# Patient Record
Sex: Female | Born: 1975
Health system: Southern US, Community
[De-identification: ages and names within clinical notes are randomized; demographics above are authoritative.]

## PROBLEM LIST (undated history)

## (undated) DIAGNOSIS — D649 Anemia, unspecified: Secondary | ICD-10-CM

## (undated) DIAGNOSIS — R519 Headache, unspecified: Secondary | ICD-10-CM

## (undated) DIAGNOSIS — R51 Headache: Secondary | ICD-10-CM

## (undated) DIAGNOSIS — M1990 Unspecified osteoarthritis, unspecified site: Secondary | ICD-10-CM

## (undated) DIAGNOSIS — J45909 Unspecified asthma, uncomplicated: Secondary | ICD-10-CM

## (undated) DIAGNOSIS — T7840XA Allergy, unspecified, initial encounter: Secondary | ICD-10-CM

## (undated) HISTORY — PX: BREAST SURGERY: SHX581

## (undated) HISTORY — DX: Allergy, unspecified, initial encounter: T78.40XA

## (undated) HISTORY — DX: Unspecified asthma, uncomplicated: J45.909

## (undated) HISTORY — DX: Anemia, unspecified: D64.9

---

## 2017-01-10 ENCOUNTER — Other Ambulatory Visit (HOSPITAL_COMMUNITY)
Admission: RE | Admit: 2017-01-10 | Discharge: 2017-01-10 | Disposition: A | Discharge status: Home / Self Care | Payer: 59 | Source: Ambulatory Visit | Attending: Obstetrics and Gynecology | Admitting: Obstetrics and Gynecology

## 2017-01-10 ENCOUNTER — Other Ambulatory Visit: Payer: Self-pay | Admitting: Obstetrics and Gynecology

## 2017-01-10 DIAGNOSIS — N852 Hypertrophy of uterus: Secondary | ICD-10-CM | POA: Diagnosis not present

## 2017-01-10 DIAGNOSIS — Z124 Encounter for screening for malignant neoplasm of cervix: Secondary | ICD-10-CM | POA: Insufficient documentation

## 2017-01-10 DIAGNOSIS — N921 Excessive and frequent menstruation with irregular cycle: Secondary | ICD-10-CM | POA: Diagnosis not present

## 2017-01-10 DIAGNOSIS — D500 Iron deficiency anemia secondary to blood loss (chronic): Secondary | ICD-10-CM | POA: Diagnosis not present

## 2017-01-10 DIAGNOSIS — Z3169 Encounter for other general counseling and advice on procreation: Secondary | ICD-10-CM | POA: Diagnosis not present

## 2017-01-13 LAB — CYTOLOGY - PAP
DIAGNOSIS: NEGATIVE
HPV: NOT DETECTED

## 2017-01-17 DIAGNOSIS — N921 Excessive and frequent menstruation with irregular cycle: Secondary | ICD-10-CM | POA: Diagnosis not present

## 2017-01-17 DIAGNOSIS — R938 Abnormal findings on diagnostic imaging of other specified body structures: Secondary | ICD-10-CM | POA: Diagnosis not present

## 2017-01-17 DIAGNOSIS — D500 Iron deficiency anemia secondary to blood loss (chronic): Secondary | ICD-10-CM | POA: Diagnosis not present

## 2017-02-28 DIAGNOSIS — D500 Iron deficiency anemia secondary to blood loss (chronic): Secondary | ICD-10-CM | POA: Diagnosis not present

## 2017-02-28 DIAGNOSIS — N9489 Other specified conditions associated with female genital organs and menstrual cycle: Secondary | ICD-10-CM | POA: Diagnosis not present

## 2017-02-28 DIAGNOSIS — R938 Abnormal findings on diagnostic imaging of other specified body structures: Secondary | ICD-10-CM | POA: Diagnosis not present

## 2017-02-28 DIAGNOSIS — N921 Excessive and frequent menstruation with irregular cycle: Secondary | ICD-10-CM | POA: Diagnosis not present

## 2017-03-16 ENCOUNTER — Ambulatory Visit (INDEPENDENT_AMBULATORY_CARE_PROVIDER_SITE_OTHER): Payer: 59 | Admitting: Family Medicine

## 2017-03-16 ENCOUNTER — Encounter: Payer: Self-pay | Admitting: Family Medicine

## 2017-03-16 VITALS — BP 122/66 | HR 82 | Temp 98.3°F | Resp 14 | Ht <= 58 in | Wt 123.0 lb

## 2017-03-16 DIAGNOSIS — Z23 Encounter for immunization: Secondary | ICD-10-CM | POA: Diagnosis not present

## 2017-03-16 DIAGNOSIS — D509 Iron deficiency anemia, unspecified: Secondary | ICD-10-CM

## 2017-03-16 DIAGNOSIS — J4520 Mild intermittent asthma, uncomplicated: Secondary | ICD-10-CM

## 2017-03-16 DIAGNOSIS — Z0000 Encounter for general adult medical examination without abnormal findings: Secondary | ICD-10-CM

## 2017-03-16 DIAGNOSIS — M1000 Idiopathic gout, unspecified site: Secondary | ICD-10-CM | POA: Diagnosis not present

## 2017-03-16 DIAGNOSIS — M109 Gout, unspecified: Secondary | ICD-10-CM | POA: Insufficient documentation

## 2017-03-16 MED ORDER — IRON-VITAMIN C 65-125 MG PO TABS: ORAL_TABLET | ORAL | Status: DC

## 2017-03-16 MED ORDER — ALBUTEROL SULFATE HFA 108 (90 BASE) MCG/ACT IN AERS: 2.0000 | INHALATION_SPRAY | RESPIRATORY_TRACT | 6 refills | Status: DC | PRN

## 2017-03-16 MED ORDER — FLUTICASONE PROPIONATE HFA 44 MCG/ACT IN AERO
2.0000 | INHALATION_SPRAY | Freq: Two times a day (BID) | RESPIRATORY_TRACT | 12 refills | Status: DC
Start: 2017-03-16 — End: 2018-07-25

## 2017-03-16 NOTE — Progress Notes (Signed)
Subjective:    Patient ID: Carrie Rivera, female    DOB: March 21, 1976, 41 y.o.   MRN: [id number]  Patient presents for New Patient CPE (is not fasting) Patient here to establish care Previous- None in past 5 years She is a pediatrician  OB/GYN Dr. Gerald Leitz PMH/FAamily history/medications reviewed  PAP SMEAR- UTD June 2018  History of Asthma- As severe as a child and then she had difficulty during her pregnancy she was actually on Advair then transition to Flovent and then she came off of that. However the past year she has noted that she gets coughing spells mostly in the morning which she will fill a little tight. She does use albuterol as needed. She does not have allergies to pollen but does have allergies to dust and mold does not require any antihistamine.  She has history of chronic iron deficiency anemia in the setting of menorrhagia her hemoglobin has been as low as 7 she was recently seen by GYN as she does have a thickened endometrium as well as a cervical polyp there planning to do D&C as well as biopsy in October. She's been taking over-the-counter iron once a day. When she does take iron regularly her hemoglobin doesn't respond. She states that she has been anemic since her 60s. It was found after syncopal event, EKG was normal, no cardiac cause found.  She states that her hemoglobin was 7.4 which initially went to GYN a couple months ago she did start taking iron and it was up to a 9 a couple of weeks ago.   He believes that she has gout. She is only a uric acid checked one time which was back in 2080 was normal. She gets swelling and pain in both great toes after she eats certain meats a few days in a row she's also had pain and swelling in her right thumb. When she takes ibuprofen couple of days the pain resolves itself. She does have family history of gout and she is [place] and there is a higher incidence of gout her brother also has gout.   I reviewed her lipids from the  physician screening last year her lipids have always been normal. She is not having thyroid disorder. She does have family history of diabetes in a sister but his diet induce her father also has coronary artery disease as had 3 stents also diet induced.  There is no history of any breast cancer colon cancer or known uterine cancer in her family. She does have a few paternal onset had hysterectomy secondary to dysfunctional uterine bleeding but the cause was unknown.   Review Of Systems:  GEN- denies fatigue, fever, weight loss,weakness, recent illness HEENT- denies eye drainage, change in vision, nasal discharge, CVS- denies chest pain, palpitations RESP- denies SOB, cough, wheeze ABD- denies N/V, change in stools, abd pain GU- denies dysuria, hematuria, dribbling, incontinence MSK- +joint pain, muscle aches, injury Neuro- denies headache, dizziness, syncope, seizure activity       Objective:    BP 122/66   Pulse 82   Temp 98.3 F (36.8 C) (Oral)   Resp 14   Ht 4\' 9"  (1.448 m)   Wt 123 lb (55.8 kg)   LMP 03/07/2017 Comment: refular  SpO2 100%   BMI 26.62 kg/m  GEN- NAD, alert and oriented x3 HEENT- PERRL, EOMI, non injected sclera, pink conjunctiva, MMM, oropharynx clear Neck- Supple, no thyromegaly CVS- RRR, no murmur RESP-CTAB ABD-NABS,soft,NT,ND Psych- normal affect and mood  EXT- No  edema Pulses- Radial, DP- 2+        Assessment & Plan:      Problem List Items Addressed This Visit      Unprioritized   Iron deficiency anemia   Relevant Medications   Iron-Vitamin C 65-125 MG TABS   Gout    Based on clinical history gout Minimal flares Avoid foods that trigger, currently treated easily with low dose OTC NSAIDS If needed try colchicine or indomethacin      Asthma, intermittent    START Flovent low dose BID  Given albuterol inhaler refill  She has been on advair in past but milder symptoms currently  Flu shot at work      Relevant Medications    fluticasone (FLOVENT HFA) 44 MCG/ACT inhaler   albuterol (PROVENTIL HFA;VENTOLIN HFA) 108 (90 Base) MCG/ACT inhaler    Other Visit Diagnoses    Routine general medical examination at a health care facility    -  Primary   CPE done, TDAP given, reviewed her fasting labs, obtain records from GYN for her anemia, recommend  She increase iron to BID dosing Low risk family for breast cancer, GYN did not recommend Mammogram       Note: This dictation was prepared with Dragon dictation along with smaller phrase technology. Any transcriptional errors that result from this process are unintentional.

## 2017-03-16 NOTE — Assessment & Plan Note (Signed)
Based on clinical history gout Minimal flares Avoid foods that trigger, currently treated easily with low dose OTC NSAIDS If needed try colchicine or indomethacin

## 2017-03-16 NOTE — Addendum Note (Signed)
Addended by: Phillips Odor on: 03/16/2017 03:31 PM   Modules accepted: Orders

## 2017-03-16 NOTE — Patient Instructions (Signed)
Release of records- Dr. Gerald Leitz  F/U as needed

## 2017-03-16 NOTE — Assessment & Plan Note (Signed)
START Flovent low dose BID  Given albuterol inhaler refill  She has been on advair in past but milder symptoms currently  Flu shot at work

## 2017-03-30 ENCOUNTER — Encounter: Payer: Self-pay | Admitting: *Deleted

## 2017-05-04 MED FILL — FLOVENT HFA 44 MCG INHALER: 44 | 30 days supply | Qty: 11 | Fill #0

## 2017-05-09 NOTE — Patient Instructions (Addendum)
Your procedure is scheduled on: Wednesday May 18, 2017 at 10:30 am  Enter through the [employer] of [facility] at: 9:00 am  Pick up the phone at the desk and dial [phone].  Call this number if you have problems the morning of surgery: [phone].  Remember: Do NOT eat food or drink any liquids after: Midnight Tuesday May 17, 2017  Take these medicines the morning of surgery with a SIP OF WATER: FLOVENT INHALER  BRING ALBUTEROL INHALER WITH YOU DAY OF SURGERY  STOP ALL VITAMINS AND SUPPLEMENTS TODAY  DO NOT SMOKE DAY OF SURGERY  Do NOT wear jewelry (body piercing), metal hair clips/bobby pins, make-up, or nail polish. Do NOT wear lotions, powders, or perfumes.  You may wear deoderant. Do NOT shave for 48 hours prior to surgery. Do NOT bring valuables to the hospital. Contacts, dentures, or bridgework may not be worn into surgery.  Have a responsible adult drive you home and stay with you for 24 hours after your procedure

## 2017-05-11 ENCOUNTER — Other Ambulatory Visit: Payer: Self-pay | Admitting: Obstetrics and Gynecology

## 2017-05-11 ENCOUNTER — Encounter (HOSPITAL_COMMUNITY): Payer: Self-pay

## 2017-05-11 ENCOUNTER — Encounter (HOSPITAL_COMMUNITY)
Admission: RE | Admit: 2017-05-11 | Discharge: 2017-05-11 | Disposition: A | Discharge status: Home / Self Care | Payer: 59 | Source: Ambulatory Visit | Attending: Obstetrics and Gynecology | Admitting: Obstetrics and Gynecology

## 2017-05-11 DIAGNOSIS — N9489 Other specified conditions associated with female genital organs and menstrual cycle: Secondary | ICD-10-CM | POA: Diagnosis not present

## 2017-05-11 DIAGNOSIS — N921 Excessive and frequent menstruation with irregular cycle: Secondary | ICD-10-CM | POA: Diagnosis not present

## 2017-05-11 DIAGNOSIS — N920 Excessive and frequent menstruation with regular cycle: Secondary | ICD-10-CM | POA: Diagnosis not present

## 2017-05-11 DIAGNOSIS — D500 Iron deficiency anemia secondary to blood loss (chronic): Secondary | ICD-10-CM | POA: Diagnosis not present

## 2017-05-11 DIAGNOSIS — Z01812 Encounter for preprocedural laboratory examination: Secondary | ICD-10-CM | POA: Insufficient documentation

## 2017-05-11 DIAGNOSIS — R9389 Abnormal findings on diagnostic imaging of other specified body structures: Secondary | ICD-10-CM | POA: Diagnosis not present

## 2017-05-11 HISTORY — DX: Headache, unspecified: R51.9

## 2017-05-11 HISTORY — DX: Headache: R51

## 2017-05-11 HISTORY — DX: Unspecified osteoarthritis, unspecified site: M19.90

## 2017-05-11 LAB — CBC
HCT: 33.9 % — ABNORMAL LOW (ref 36.0–46.0)
HEMOGLOBIN: 10.7 g/dL — AB (ref 12.0–15.0)
MCH: 28.7 pg (ref 26.0–34.0)
MCHC: 31.6 g/dL (ref 30.0–36.0)
MCV: 90.9 fL (ref 78.0–100.0)
Platelets: 277 10*3/uL (ref 150–400)
RBC: 3.73 MIL/uL — ABNORMAL LOW (ref 3.87–5.11)
RDW: 19.1 % — ABNORMAL HIGH (ref 11.5–15.5)
WBC: 6.7 10*3/uL (ref 4.0–10.5)

## 2017-05-18 ENCOUNTER — Ambulatory Visit (HOSPITAL_COMMUNITY): Payer: 59 | Admitting: Anesthesiology

## 2017-05-18 ENCOUNTER — Encounter (HOSPITAL_COMMUNITY): Payer: Self-pay | Admitting: *Deleted

## 2017-05-18 ENCOUNTER — Encounter (HOSPITAL_COMMUNITY)
Admission: RE | Disposition: A | Discharge status: Home / Self Care | Payer: Self-pay | Source: Ambulatory Visit | Attending: Obstetrics and Gynecology

## 2017-05-18 ENCOUNTER — Ambulatory Visit (HOSPITAL_COMMUNITY)
Admission: RE | Admit: 2017-05-18 | Discharge: 2017-05-18 | Disposition: A | Discharge status: Home / Self Care | Payer: 59 | Source: Ambulatory Visit | Attending: Obstetrics and Gynecology | Admitting: Obstetrics and Gynecology

## 2017-05-18 DIAGNOSIS — R9389 Abnormal findings on diagnostic imaging of other specified body structures: Secondary | ICD-10-CM | POA: Insufficient documentation

## 2017-05-18 DIAGNOSIS — Z825 Family history of asthma and other chronic lower respiratory diseases: Secondary | ICD-10-CM | POA: Diagnosis not present

## 2017-05-18 DIAGNOSIS — Z79899 Other long term (current) drug therapy: Secondary | ICD-10-CM | POA: Insufficient documentation

## 2017-05-18 DIAGNOSIS — N920 Excessive and frequent menstruation with regular cycle: Secondary | ICD-10-CM | POA: Diagnosis present

## 2017-05-18 DIAGNOSIS — D509 Iron deficiency anemia, unspecified: Secondary | ICD-10-CM | POA: Diagnosis not present

## 2017-05-18 DIAGNOSIS — N8501 Benign endometrial hyperplasia: Secondary | ICD-10-CM | POA: Diagnosis not present

## 2017-05-18 DIAGNOSIS — J45909 Unspecified asthma, uncomplicated: Secondary | ICD-10-CM | POA: Diagnosis not present

## 2017-05-18 DIAGNOSIS — Z8349 Family history of other endocrine, nutritional and metabolic diseases: Secondary | ICD-10-CM | POA: Insufficient documentation

## 2017-05-18 DIAGNOSIS — Z833 Family history of diabetes mellitus: Secondary | ICD-10-CM | POA: Insufficient documentation

## 2017-05-18 DIAGNOSIS — N840 Polyp of corpus uteri: Secondary | ICD-10-CM | POA: Diagnosis not present

## 2017-05-18 DIAGNOSIS — Z8249 Family history of ischemic heart disease and other diseases of the circulatory system: Secondary | ICD-10-CM | POA: Diagnosis not present

## 2017-05-18 DIAGNOSIS — N921 Excessive and frequent menstruation with irregular cycle: Secondary | ICD-10-CM | POA: Insufficient documentation

## 2017-05-18 DIAGNOSIS — L309 Dermatitis, unspecified: Secondary | ICD-10-CM | POA: Diagnosis not present

## 2017-05-18 DIAGNOSIS — D500 Iron deficiency anemia secondary to blood loss (chronic): Secondary | ICD-10-CM | POA: Diagnosis not present

## 2017-05-18 HISTORY — PX: DILATATION & CURETTAGE/HYSTEROSCOPY WITH MYOSURE: SHX6511

## 2017-05-18 LAB — PREGNANCY, URINE: Preg Test, Ur: NEGATIVE

## 2017-05-18 SURGERY — DILATATION & CURETTAGE/HYSTEROSCOPY WITH MYOSURE
Anesthesia: General

## 2017-05-18 MED ORDER — SILVER NITRATE-POT NITRATE 75-25 % EX MISC
CUTANEOUS | Status: DC | PRN
  Administered 2017-05-18: 1

## 2017-05-18 MED ORDER — BUPIVACAINE HCL (PF) 0.25 % IJ SOLN
INTRAMUSCULAR | Status: AC
  Filled 2017-05-18: qty 30

## 2017-05-18 MED ORDER — SCOPOLAMINE 1 MG/3DAYS TD PT72
1.0000 | MEDICATED_PATCH | Freq: Once | TRANSDERMAL | Status: DC
  Administered 2017-05-18: 1.5 mg via TRANSDERMAL

## 2017-05-18 MED ORDER — IBUPROFEN 800 MG PO TABS: 800.0000 mg | ORAL_TABLET | Freq: Three times a day (TID) | ORAL | 0 refills | Status: DC | PRN

## 2017-05-18 MED ORDER — ACETAMINOPHEN 325 MG PO TABS: 325.0000 mg | ORAL_TABLET | ORAL | Status: DC | PRN

## 2017-05-18 MED ORDER — KETOROLAC TROMETHAMINE 30 MG/ML IJ SOLN: 30.0000 mg | Freq: Once | INTRAMUSCULAR | Status: DC | PRN

## 2017-05-18 MED ORDER — GLYCOPYRROLATE 0.2 MG/ML IJ SOLN
INTRAMUSCULAR | Status: AC
  Filled 2017-05-18: qty 1

## 2017-05-18 MED ORDER — SODIUM CHLORIDE 0.9 % IR SOLN
Status: DC | PRN
  Administered 2017-05-18: 3000 mL

## 2017-05-18 MED ORDER — FLUMAZENIL 0.5 MG/5ML IV SOLN
INTRAVENOUS | Status: AC
  Filled 2017-05-18: qty 5

## 2017-05-18 MED ORDER — KETOROLAC TROMETHAMINE 30 MG/ML IJ SOLN
INTRAMUSCULAR | Status: AC
  Filled 2017-05-18: qty 1

## 2017-05-18 MED ORDER — ONDANSETRON HCL 4 MG/2ML IJ SOLN
INTRAMUSCULAR | Status: DC | PRN
  Administered 2017-05-18: 4 mg via INTRAVENOUS

## 2017-05-18 MED ORDER — ACETAMINOPHEN 160 MG/5ML PO SOLN: 325.0000 mg | ORAL | Status: DC | PRN

## 2017-05-18 MED ORDER — FENTANYL CITRATE (PF) 250 MCG/5ML IJ SOLN
INTRAMUSCULAR | Status: DC | PRN
  Administered 2017-05-18 (×3): 50 ug via INTRAVENOUS

## 2017-05-18 MED ORDER — ONDANSETRON HCL 4 MG/2ML IJ SOLN: 4.0000 mg | Freq: Once | INTRAMUSCULAR | Status: DC | PRN

## 2017-05-18 MED ORDER — BUPIVACAINE HCL (PF) 0.25 % IJ SOLN
INTRAMUSCULAR | Status: DC | PRN
  Administered 2017-05-18: 20 mL

## 2017-05-18 MED ORDER — OXYCODONE HCL 5 MG PO TABS: 5.0000 mg | ORAL_TABLET | Freq: Once | ORAL | Status: DC | PRN

## 2017-05-18 MED ORDER — GLYCOPYRROLATE 0.2 MG/ML IJ SOLN
INTRAMUSCULAR | Status: DC | PRN
  Administered 2017-05-18: 0.1 mg via INTRAVENOUS

## 2017-05-18 MED ORDER — DEXAMETHASONE SODIUM PHOSPHATE 4 MG/ML IJ SOLN
INTRAMUSCULAR | Status: DC | PRN
  Administered 2017-05-18: 10 mg via INTRAVENOUS

## 2017-05-18 MED ORDER — ONDANSETRON HCL 4 MG/2ML IJ SOLN
INTRAMUSCULAR | Status: AC
  Filled 2017-05-18: qty 2

## 2017-05-18 MED ORDER — LIDOCAINE HCL (CARDIAC) 20 MG/ML IV SOLN
INTRAVENOUS | Status: DC | PRN
  Administered 2017-05-18: 60 mg via INTRAVENOUS

## 2017-05-18 MED ORDER — HYDROCODONE-ACETAMINOPHEN 5-325 MG PO TABS: 1.0000 | ORAL_TABLET | Freq: Four times a day (QID) | ORAL | 0 refills | Status: DC | PRN

## 2017-05-18 MED ORDER — PROPOFOL 10 MG/ML IV BOLUS
INTRAVENOUS | Status: DC | PRN
  Administered 2017-05-18: 170 mg via INTRAVENOUS

## 2017-05-18 MED ORDER — SILVER NITRATE-POT NITRATE 75-25 % EX MISC
CUTANEOUS | Status: AC
  Filled 2017-05-18: qty 1

## 2017-05-18 MED ORDER — FLUMAZENIL 0.5 MG/5ML IV SOLN
INTRAVENOUS | Status: DC | PRN
  Administered 2017-05-18: 0.2 mg via INTRAVENOUS

## 2017-05-18 MED ORDER — FENTANYL CITRATE (PF) 100 MCG/2ML IJ SOLN: 25.0000 ug | INTRAMUSCULAR | Status: DC | PRN

## 2017-05-18 MED ORDER — LIDOCAINE HCL (CARDIAC) 20 MG/ML IV SOLN
INTRAVENOUS | Status: AC
  Filled 2017-05-18: qty 5

## 2017-05-18 MED ORDER — FENTANYL CITRATE (PF) 100 MCG/2ML IJ SOLN
INTRAMUSCULAR | Status: AC
  Filled 2017-05-18: qty 4

## 2017-05-18 MED ORDER — PHENYLEPHRINE HCL 10 MG/ML IJ SOLN
INTRAMUSCULAR | Status: DC | PRN
  Administered 2017-05-18 (×3): 40 ug via INTRAVENOUS

## 2017-05-18 MED ORDER — LACTATED RINGERS IV SOLN
INTRAVENOUS | Status: DC
  Administered 2017-05-18 (×2): via INTRAVENOUS

## 2017-05-18 MED ORDER — MIDAZOLAM HCL 2 MG/2ML IJ SOLN
INTRAMUSCULAR | Status: DC | PRN
  Administered 2017-05-18 (×2): 1 mg via INTRAVENOUS

## 2017-05-18 MED ORDER — OXYCODONE HCL 5 MG/5ML PO SOLN: 5.0000 mg | Freq: Once | ORAL | Status: DC | PRN

## 2017-05-18 MED ORDER — PROPOFOL 10 MG/ML IV BOLUS
INTRAVENOUS | Status: AC
  Filled 2017-05-18: qty 40

## 2017-05-18 MED ORDER — KETOROLAC TROMETHAMINE 30 MG/ML IJ SOLN
INTRAMUSCULAR | Status: DC | PRN
  Administered 2017-05-18: 30 mg via INTRAVENOUS

## 2017-05-18 MED ORDER — MIDAZOLAM HCL 2 MG/2ML IJ SOLN
INTRAMUSCULAR | Status: AC
  Filled 2017-05-18: qty 2

## 2017-05-18 MED ORDER — DEXAMETHASONE SODIUM PHOSPHATE 10 MG/ML IJ SOLN
INTRAMUSCULAR | Status: AC
  Filled 2017-05-18: qty 1

## 2017-05-18 MED ORDER — SCOPOLAMINE 1 MG/3DAYS TD PT72
MEDICATED_PATCH | TRANSDERMAL | Status: AC
  Filled 2017-05-18: qty 1

## 2017-05-18 MED ORDER — MEPERIDINE HCL 25 MG/ML IJ SOLN: 6.2500 mg | INTRAMUSCULAR | Status: DC | PRN

## 2017-05-18 MED ORDER — PHENYLEPHRINE 40 MCG/ML (10ML) SYRINGE FOR IV PUSH (FOR BLOOD PRESSURE SUPPORT)
PREFILLED_SYRINGE | INTRAVENOUS | Status: AC
  Filled 2017-05-18: qty 10

## 2017-05-18 MED FILL — IBUPROFEN 800 MG TABS: 800 | 10 days supply | Qty: 30 | Fill #0

## 2017-05-18 SURGICAL SUPPLY — 18 items
CANISTER SUCT 3000ML PPV (MISCELLANEOUS) ×2 IMPLANT
CATH ROBINSON RED A/P 16FR (CATHETERS) ×2 IMPLANT
CONTAINER PREFILL 10% NBF 60ML (FORM) ×4 IMPLANT
DEVICE MYOSURE LITE (MISCELLANEOUS) ×2 IMPLANT
DEVICE MYOSURE REACH (MISCELLANEOUS) IMPLANT
FILTER ARTHROSCOPY CONVERTOR (FILTER) ×2 IMPLANT
GLOVE BIOGEL M 6.5 STRL (GLOVE) ×4 IMPLANT
GLOVE BIOGEL PI IND STRL 6.5 (GLOVE) ×1 IMPLANT
GLOVE BIOGEL PI IND STRL 7.0 (GLOVE) ×1 IMPLANT
GLOVE BIOGEL PI INDICATOR 6.5 (GLOVE) ×1
GLOVE BIOGEL PI INDICATOR 7.0 (GLOVE) ×1
GOWN STRL REUS W/TWL LRG LVL3 (GOWN DISPOSABLE) ×4 IMPLANT
PACK VAGINAL MINOR WOMEN LF (CUSTOM PROCEDURE TRAY) ×2 IMPLANT
PAD OB MATERNITY 4.3X12.25 (PERSONAL CARE ITEMS) ×2 IMPLANT
SEAL ROD LENS SCOPE MYOSURE (ABLATOR) ×2 IMPLANT
TOWEL OR 17X24 6PK STRL BLUE (TOWEL DISPOSABLE) ×4 IMPLANT
TUBING AQUILEX INFLOW (TUBING) ×2 IMPLANT
TUBING AQUILEX OUTFLOW (TUBING) ×2 IMPLANT

## 2017-05-18 NOTE — Transfer of Care (Signed)
Immediate Anesthesia Transfer of Care Note  Patient: Carrie Rivera  Procedure(s) Performed: DILATATION & CURETTAGE/HYSTEROSCOPY WITH MYOSURE (N/A )  Patient Location: PACU  Anesthesia Type:General  Level of Consciousness: awake, alert  and oriented  Airway & Oxygen Therapy: Patient Spontanous Breathing and Patient connected to nasal cannula oxygen  Post-op Assessment: Report given to RN and Post -op Vital signs reviewed and stable  Post vital signs: Reviewed and stable  Last Vitals:  Vitals:   05/18/17 0915  BP: 107/78  Pulse: 69  Resp: 16  Temp: 36.6 C  SpO2: 100%    Last Pain:  Vitals:   05/18/17 0915  TempSrc: Oral      Patients Stated Pain Goal: 4 (05/18/17 0915)  Complications: No apparent anesthesia complications

## 2017-05-18 NOTE — Anesthesia Preprocedure Evaluation (Signed)
Anesthesia Evaluation  Patient identified by MRN, date of birth, ID band Patient awake    Reviewed: Allergy & Precautions, NPO status , Patient's Chart, lab work & pertinent test results  Airway Mallampati: I       Dental no notable dental hx. (+) Teeth Intact   Pulmonary asthma ,    Pulmonary exam normal breath sounds clear to auscultation       Cardiovascular negative cardio ROS Normal cardiovascular exam Rhythm:Regular Rate:Normal     Neuro/Psych negative psych ROS   GI/Hepatic Neg liver ROS,   Endo/Other  negative endocrine ROS  Renal/GU negative Renal ROS     Musculoskeletal   Abdominal Normal abdominal exam  (+)   Peds  Hematology  (+) anemia ,   Anesthesia Other Findings   Reproductive/Obstetrics negative OB ROS                             Anesthesia Physical Anesthesia Plan  ASA: II  Anesthesia Plan: General   Post-op Pain Management:    Induction: Intravenous  PONV Risk Score and Plan: 4 or greater and Ondansetron, Dexamethasone, Midazolam and Scopolamine patch - Pre-op  Airway Management Planned: LMA  Additional Equipment:   Intra-op Plan:   Post-operative Plan:   Informed Consent: I have reviewed the patients History and Physical, chart, labs and discussed the procedure including the risks, benefits and alternatives for the proposed anesthesia with the patient or authorized representative who has indicated his/her understanding and acceptance.     Plan Discussed with: CRNA and Surgeon  Anesthesia Plan Comments:         Anesthesia Quick Evaluation

## 2017-05-18 NOTE — Op Note (Signed)
05/18/2017  11:12 AM  PATIENT:  Carrie Rivera  41 y.o. female  PRE-OPERATIVE DIAGNOSIS:   Endometrial Mass Thickened Endometrium Menorrhagia w/Irregular Cycle  POST-OPERATIVE DIAGNOSIS:   Endometrial Mass Thickened Endometrium Menorrhagia w/Irregular Cycle  PROCEDURE:  Procedure(s) with comments: DILATATION & CURETTAGE/HYSTEROSCOPY WITH MYOSURE (N/A) - Polypectomy  SURGEON:  Surgeon(s) and Role:    Gerald Leitz* Oswell Say, MD - Primary  PHYSICIAN ASSISTANT:   ASSISTANTS: none   ANESTHESIA:   general  EBL:  10 mL   BLOOD ADMINISTERED:none  DRAINS: none   LOCAL MEDICATIONS USED:  MARCAINE     SPECIMEN:  Source of Specimen:  endometrial polyp and curettings   DISPOSITION OF SPECIMEN:  PATHOLOGY  COUNTS:  YES  TOURNIQUET:  * No tourniquets in log *  DICTATION: .Dragon Dictation  PLAN OF CARE: Discharge to home after PACU  PATIENT DISPOSITION:  PACU - hemodynamically stable.   Delay start of Pharmacological VTE agent (>24hrs) due to surgical blood loss or risk of bleeding: yes  Findings normal external genitalia vagina and cervix. Polypoid endometrium with proliferation.   Procedure: Patient was taken to the operating room where she was placed under general anesthesia. She was placed in the dorsal lithotomy position. She was prepped and draped in the usual sterile fashion. A speculum was placed into the vaginal vault. The anterior lip of the cervix was grasped with a single-tooth tenaculum. Quarter percent Marcaine was injected at the 4 and 8:00 positions of the cervix. The cervix was then sounded to 8 cm. The cervix was dilated to approximately 6 mm. Myosure  operative  hysteroscope was inserted. The findings noted above. myosure lite  polyp blade was introduced throught the hysteroscope. The endometrial polyp was removed without difficulty.  There was no evidence of perforation. Hysteroscope was then removed. Sharp curettage was performed. hysteroscope was replaced. There was no  evidence of perforation. The single-tooth tenaculum was removed from the anterior lip of the cervix. Patient was noted to have bleeding from the tenaculum site. Silver nitrate was applied and excellent hemostasis was noted. The speculum was removed from the patient's vagina. She was awakened from anesthesia taken care  To the recovery  room awake and in stable condition. Sponge lap and needle counts were correct x2.

## 2017-05-18 NOTE — Discharge Instructions (Signed)
DISCHARGE INSTRUCTIONS: HYSTEROSCOPY  The following instructions have been prepared to help you care for yourself upon your return home.  May Remove Scop patch on or before 05/21/17  May take Ibuprofen after 5:00pm today  May take stool softner while taking narcotic pain medication to prevent constipation.  Drink plenty of water.  Personal hygiene:  Use sanitary pads for vaginal drainage, not tampons.  Shower the day after your procedure.  NO tub baths, pools or Jacuzzis for 2-3 weeks.  Wipe front to back after using the bathroom.  Activity and limitations:  Do NOT drive or operate any equipment for 24 hours. The effects of anesthesia are still present and drowsiness may result.  Do NOT rest in bed all day.  Walking is encouraged.  Walk up and down stairs slowly.  You may resume your normal activity in one to two days or as indicated by your physician. Sexual activity: NO intercourse for at least 2 weeks after the procedure, or as indicated by your Doctor.  Diet: Eat a light meal as desired this evening. You may resume your usual diet tomorrow.  Return to Work: You may resume your work activities in one to two days or as indicated by Therapist, sportsyour Doctor.  What to expect after your surgery: Expect to have vaginal bleeding/discharge for 2-3 days and spotting for up to 10 days. It is not unusual to have soreness for up to 1-2 weeks. You may have a slight burning sensation when you urinate for the first day. Mild cramps may continue for a couple of days. You may have a regular period in 2-6 weeks.  Call your doctor for any of the following:  Excessive vaginal bleeding or clotting, saturating and changing one pad every hour.  Inability to urinate 6 hours after discharge from hospital.  Pain not relieved by pain medication.  Fever of 100.4 F or greater.  Unusual vaginal discharge or odor.  Return to office _________________Call for an appointment  ___________________ Patients signature: ______________________ Nurses signature ________________________  Post Anesthesia Care Unit [id number]    Post Anesthesia Home Care Instructions  Activity: Get plenty of rest for the remainder of the day. A responsible individual must stay with you for 24 hours following the procedure.  For the next 24 hours, DO NOT: -Drive a car -Advertising copywriterperate machinery -Drink alcoholic beverages -Take any medication unless instructed by your physician -Make any legal decisions or sign important papers.  Meals: Start with liquid foods such as gelatin or soup. Progress to regular foods as tolerated. Avoid greasy, spicy, heavy foods. If nausea and/or vomiting occur, drink only clear liquids until the nausea and/or vomiting subsides. Call your physician if vomiting continues.  Special Instructions/Symptoms: Your throat may feel dry or sore from the anesthesia or the breathing tube placed in your throat during surgery. If this causes discomfort, gargle with warm salt water. The discomfort should disappear within 24 hours.  If you had a scopolamine patch placed behind your ear for the management of post- operative nausea and/or vomiting:  1. The medication in the patch is effective for 72 hours, after which it should be removed.  Wrap patch in a tissue and discard in the trash. Wash hands thoroughly with soap and water. 2. You may remove the patch earlier than 72 hours if you experience unpleasant side effects which may include dry mouth, dizziness or visual disturbances. 3. Avoid touching the patch. Wash your hands with soap and water after contact with the patch.

## 2017-05-18 NOTE — Anesthesia Procedure Notes (Signed)
Procedure Name: LMA Insertion Date/Time: 05/18/2017 10:24 AM Performed by: Graciela HusbandsFUSSELL, Alexio Sroka O Pre-anesthesia Checklist: Patient identified, Patient being monitored, Emergency Drugs available, Timeout performed and Suction available Patient Re-evaluated:Patient Re-evaluated prior to induction Oxygen Delivery Method: Circle System Utilized and Circle system utilized Preoxygenation: Pre-oxygenation with 100% oxygen Induction Type: IV induction Ventilation: Mask ventilation without difficulty LMA: LMA inserted LMA Size: 4.0 Number of attempts: 1 Placement Confirmation: positive ETCO2 and breath sounds checked- equal and bilateral Tube secured with: Tape Dental Injury: Teeth and Oropharynx as per pre-operative assessment

## 2017-05-18 NOTE — H&P (Signed)
Chief Complaint(s):   abnormal uterine bleeding   HPI:  General 41 y/o presents for preoperative history and physical exam in preparation for D&C / hysteroscopy removal of endometrial mass with myosure . she has menorrhagia and intermenstrual bleeding. she reports spotting that occurs during hte time of ovulation and can last until her next menses starts. she has a h/o anemia. Her hgb 09/2015 At [facility] physicans care was 7.4. she reports very heavy menses. changing an overnight maxi pad 8-10 times a day on her heaviest day. her menses last 8-10 days and 2 days are decribed as very heavy. she is interested in future fertility . she had an ultrasound [place]2011 in [place] to evaluate pelvic pain that showed a thickened endometrium 1.9 cm  Ultrasound 01/17/2017 revealed a 9 cm x 5 cm x 5 cm uterus. no uterine anomalies are seen. The endometrium is thickened with ?hyperechoic mass fundal that is 1.2 cm x 0.8 cm no blood flow is noted. Her ovaries appear normal bilaterally.  Current Medication:  Taking  Vitron-C(Iron/C) 65-125 MG Tablet 1 tablet Orally twice a day     Vitamin D3 5000 UNIT Tablet 1 tablet Orally Once a week     Flovent HFA(Fluticasone Propionate HFA) 44 MCG/ACT Aerosol 2 puffs Inhalation Twice a day     PreserVision AREDS - Capsule 1 tablet Orally twice a month     Albuterol HFA as needed   Not-Taking  Lysteda 650 MG Tablet 2 tablets Orally Three times daily   Discontinued  Claritin(Loratadine) as needed     Iron 325 (65 Fe) MG Tablet 1 tablet Orally Once a day     Medication List reviewed and reconciled with the patient   Medical History:   Asthma     Iron deficiency anemia     eczema      Allergies/Intolerance:   N.K.D.A.   Gyn History:   Sexual activity currently sexually active. Periods : every month. LMP 05/05/2017. Birth control none. Last pap smear date 01/10/17 - WNL negative HPV. Denies Last mammogram date. Denies Abnormal pap smear. Denies STD.   OB History:    Number of pregnancies 2. Pregnancy # 1 live birth, C-section delivery at 37 weeks.[name] Kitchen. arrest of descent . Pregnancy # 2 live birth, C-section, at 638 weeks .   Surgical History:   C-section x2   Hospitalization:   childbirth   Family History:   Father: alive, coronary artery disease, gout, asthma , diagnosed with Hypertension    Mother: alive, hyperlipidemia, gout, asthma    Paternal Grand Father: deceased, stroke    Paternal Grand Mother: deceased    Maternal Grand Father: deceased    Maternal Grand Mother: deceased    Sister 1: alive, diagnosed with Diabetes    Paternal aunt: diagnosed with Diabetes    2daughter(s) .    denies any GYN family cancer hx.  Social History:  General Tobacco use cigarettes: Never smoked, Tobacco history last updated 05/11/2017.  no Alcohol.  no Recreational drug use.  Marital Status: married.  Children: 2, daughter (s).  OCCUPATION: pediatrician.  ROS: CONSTITUTIONAL none" options="no,yes" propid="91" itemid="[id number]" categoryid="10464" encounterid="[id number]"Fatigue none. none today" options="no,yes" propid="91" itemid="10467" categoryid="10464" encounterid="[id number]"Fever none today.  CARDIOLOGY none" options="no,yes" propid="91" itemid="[id number]" categoryid="10488" encounterid="[id number]"Chest pain none.  RESPIRATORY no" options="no" propid="91" itemid="270013" categoryid="138132" encounterid="[id number]"Shortness of breath no. no" options="no,yes" propid="91" itemid="172745" categoryid="138132" encounterid="[id number]"Cough no.  GASTROENTEROLOGY none" options="no,yes" propid="91" itemid="193447" categoryid="10494" encounterid="[id number]"Appetite change none. no" options="no,yes" propid="91" itemid="193449" categoryid="10494" encounterid="[id number]"Change in bowel habits no.  FEMALE REPRODUCTIVE no" options="no,yes"  propid="91" itemid="196298" categoryid="10525" encounterid="[id number]"Breast lumps or discharge no. none" options="no,yes" propid="91"  itemid="186083" categoryid="10525" encounterid="[id number]"Breast pain none. none" options="no,yes" propid="91" itemid="138198" categoryid="10525" encounterid="[id number]"Dyspareunia none. no" options="no,yes" propid="91" itemid="202654" categoryid="10525" encounterid="[id number]"Dysuria no. none" options="no,yes" propid="91" itemid="186082" categoryid="10525" encounterid="[id number]"Pelvic pain none. no" options="no,yes" propid="91" itemid="199173" categoryid="10525" encounterid="[id number]"Regular menses no. no" options="no,yes" propid="91" itemid="278230" categoryid="10525" encounterid="[id number]"Unusual vaginal discharge no. no" options="no,yes" propid="91" itemid="278942" categoryid="10525" encounterid="[id number]"Vaginal itching no. no" options="no,yes" propid="91" itemid="278837" categoryid="10525" encounterid="[id number]"Vulvar/labial lesion no.  NEUROLOGY none" options="no,yes" propid="91" itemid="193627" categoryid="12512" encounterid="[id number]"Migraines none. none" options="no,yes" propid="91" itemid="12514" categoryid="12512" encounterid="[id number]"Tingling/numbness none. none" options="no,yes" propid="91" itemid="193467" categoryid="12512" encounterid="[id number]"Visual changes none.  PSYCHOLOGY no" options="" propid="91" itemid="275919" categoryid="10520" encounterid="[id number]"Depression no.  SKIN no" options="no,yes" propid="91" itemid="269383" categoryid="202750" encounterid="[id number]"Rash no. no" options="no,yes" propid="91" itemid="202757" categoryid="202750" encounterid="[id number]"Suspicious lesions no.  ENDOCRINOLOGY none" options="no,yes" propid="91" itemid="202624" categoryid="12508" encounterid="[id number]"Hot flashes none. no unintentional" options="no,yes" propid="91" itemid="193436" categoryid="12508" encounterid="[id number]"Weight gain no unintentional. none" options="no,yes" propid="91" itemid="138164" categoryid="12508" encounterid="[id number]"Weight loss none.  HEMATOLOGY/LYMPH no" options="no,yes" propid="91"  itemid="193454" categoryid="138157" encounterid="[id number]"Anemia no.    Objective: Vitals:  Wt 124, Wt change 1.2 lb, Ht 57, BMI 26.83, Pulse sitting 76, BP sitting 102/70  Past Results:  Examination:  Physical Examination: GENERAL in NAD, pleasant"Patient appears in NAD, pleasant. well developed"Build: well developed. well-appearing"General Appearance: well-appearing.  LUNGS clear to auscultation"Breath sounds: clear to auscultation. no"Dyspnea: no.  HEART none"Murmurs: none. normal"Rate: normal. regular"Rhythm: regular.  ABDOMEN no masses,tenderness,organomegaly, no CVAT"General: no masses,tenderness,organomegaly, no CVAT.  FEMALE GENITOURINARY no mass, non tender"Adnexa: no mass, non tender. normal, no lesions"Anus/perineum: normal, no lesions. normal appearance , no lesions/discharge/bleeding, , good pelvic support , external os normal "Cervix/ cuff: normal appearance , no lesions/discharge/bleeding, , good pelvic support , external os normal . normal, no lesions, no skin discoloration, no lymphadenopathy"External genitalia: normal, no lesions, no skin discoloration, no lymphadenopathy. normal external meatus"Urethra: normal external meatus. normal size/shape/consistency, freely mobile, non tender"Uterus: normal size/shape/consistency, freely mobile, non tender. deferred"Rectum: deferred. pink/moist mucosa, no lesions, no abnormal discharge, odorless"Vagina: pink/moist mucosa, no lesions, no abnormal discharge, odorless. normal, no lesions, no skin discoloration, non tender"Vulva: normal, no lesions, no skin discoloration, non tender.  EXTREMITIES FROM of all extremities"Extremities FROM of all extremities.  NEUROLOGICAL normal"Gait: normal. alert and oriented x 3"Orientation: alert and oriented x 3.    Assessment: Assessment:  Thickened endometrium - R93.8 (Primary)     Menorrhagia with irregular cycle - N92.1     Endometrial mass - N94.89     Iron deficiency anemia due to chronic  blood loss - D50.0     Plan: Treatment:  Thickened endometrium  Notes: plan hystereoscopy D&C possible polypectomy to rule out hyperplasia or malignancy. . . rb/a of surgery discussed with the patient including but not limited to infection bleeding perforation of the uterus with the need for further surgery. pt voiced understanding and desires to proceed.  Menorrhagia with irregular cycle  Notes: pt desires future fertility ... plan hystereoscopy D&C possible polypectomy to rule out hyperplasia or malignancy. . . rb/a of surgery discussed with the patient including but not limited to infection bleeding perforation of the uterus with the need for further surgery. pt voiced understanding and desires to proceed.  Endometrial mass  Notes: plan hystereoscopy D&C possible polypectomy to rule out hyperplasia or malignancy. . . rb/a of surgery discussed with the patient including but not limited to infection bleeding perforation of the uterus with the need for further surgery. pt voiced understanding and desires to proceed.

## 2017-05-19 ENCOUNTER — Encounter (HOSPITAL_COMMUNITY): Payer: Self-pay | Admitting: Obstetrics and Gynecology

## 2017-05-19 NOTE — Anesthesia Postprocedure Evaluation (Signed)
Anesthesia Post Note  Patient: Carrie DrillingVivian Rivera  Procedure(s) Performed: DILATATION & CURETTAGE/HYSTEROSCOPY WITH MYOSURE (N/A )     Patient location during evaluation: PACU Anesthesia Type: General Level of consciousness: awake Pain management: pain level controlled Vital Signs Assessment: post-procedure vital signs reviewed and stable Cardiovascular status: stable Postop Assessment: no apparent nausea or vomiting Anesthetic complications: no    Last Vitals:  Vitals:   05/18/17 1200 05/18/17 1235  BP: 99/69 101/69  Pulse: 71 69  Resp: 16 18  Temp: 36.8 C   SpO2: 100% 100%    Last Pain:  Vitals:   05/18/17 0915  TempSrc: Oral   Pain Goal: Patients Stated Pain Goal: 4 (05/18/17 0915)               Chrisanne Loose JR,JOHN Susann GivensFRANKLIN

## 2017-05-25 ENCOUNTER — Telehealth: Payer: Self-pay | Admitting: Family Medicine

## 2017-05-25 DIAGNOSIS — Z1239 Encounter for other screening for malignant neoplasm of breast: Secondary | ICD-10-CM

## 2017-05-25 NOTE — Telephone Encounter (Signed)
Spoke to patient  I spoke with her GYN about her recent Dand C she will likely need another endometrial biopsy  recommend mammogram be done I will go ahead and order mammogram , pt given number to call and schedule

## 2017-05-26 ENCOUNTER — Other Ambulatory Visit: Payer: Self-pay | Admitting: Family Medicine

## 2017-05-26 ENCOUNTER — Encounter: Payer: Self-pay | Admitting: Family Medicine

## 2017-05-26 DIAGNOSIS — Z1231 Encounter for screening mammogram for malignant neoplasm of breast: Secondary | ICD-10-CM

## 2017-05-30 DIAGNOSIS — N8501 Benign endometrial hyperplasia: Secondary | ICD-10-CM | POA: Diagnosis not present

## 2017-05-30 MED FILL — MEGESTROL 40 MG TABLET: 40 | 30 days supply | Qty: 30 | Fill #0

## 2017-06-06 MED FILL — FLOVENT HFA 44 MCG INHALER: 44 | 30 days supply | Qty: 11 | Fill #1

## 2017-06-13 ENCOUNTER — Ambulatory Visit (HOSPITAL_COMMUNITY)
Admission: RE | Admit: 2017-06-13 | Discharge: 2017-06-13 | Disposition: A | Discharge status: Home / Self Care | Payer: 59 | Source: Ambulatory Visit | Attending: Family Medicine | Admitting: Family Medicine

## 2017-06-13 DIAGNOSIS — Z1231 Encounter for screening mammogram for malignant neoplasm of breast: Secondary | ICD-10-CM | POA: Insufficient documentation

## 2017-06-23 MED FILL — MEGESTROL 40 MG TABLET: 40 | 30 days supply | Qty: 30 | Fill #1

## 2017-07-21 MED FILL — MEGESTROL 40 MG TABLET: 40 | 30 days supply | Qty: 30 | Fill #2

## 2017-07-24 DIAGNOSIS — Z760 Encounter for issue of repeat prescription: Secondary | ICD-10-CM | POA: Diagnosis not present

## 2017-08-08 MED FILL — FLOVENT HFA 44 MCG INHALER: 44 | 30 days supply | Qty: 11 | Fill #2

## 2017-08-08 MED FILL — SHIPPING COST: 1 days supply | Qty: 1 | Fill #0

## 2017-08-18 ENCOUNTER — Telehealth: Payer: Self-pay | Admitting: *Deleted

## 2017-08-18 NOTE — Telephone Encounter (Signed)
Received call from patient.   Reports that she has adopted a new dog, and her asthma has been flaring up.   Reports that she has doubled her dose of the Flovent. (4 puffs BID). Due to slight wheezing.   Requested MD to advise on medication alternatives. States that she can be called at any time tomorrow if you have questions.

## 2017-08-19 MED ORDER — PREDNISONE 20 MG PO TABS: ORAL_TABLET | ORAL | 0 refills | Status: DC

## 2017-08-19 NOTE — Telephone Encounter (Signed)
Call placed to patient and patient made aware per VM.  

## 2017-08-19 NOTE — Telephone Encounter (Signed)
Recommend she decrease base to 2 puff BID Use albuterol Send Prednisone taper 60mg  x 2 days, then 40mg  x 2 then 20mg  x 2 , 10 mg x 2 Add anti-histamine zyrtec, claritin if not doing so already

## 2017-08-23 MED FILL — MEGESTROL 40 MG TABLET: 40 | 30 days supply | Qty: 60 | Fill #0

## 2017-08-23 MED FILL — SHIPPING COST: 1 days supply | Qty: 1 | Fill #1

## 2017-09-05 ENCOUNTER — Other Ambulatory Visit: Payer: Self-pay | Admitting: Obstetrics and Gynecology

## 2017-09-05 DIAGNOSIS — N8501 Benign endometrial hyperplasia: Secondary | ICD-10-CM | POA: Diagnosis not present

## 2017-09-05 DIAGNOSIS — N858 Other specified noninflammatory disorders of uterus: Secondary | ICD-10-CM | POA: Diagnosis not present

## 2017-09-05 DIAGNOSIS — Z3202 Encounter for pregnancy test, result negative: Secondary | ICD-10-CM | POA: Diagnosis not present

## 2017-09-05 MED FILL — FLOVENT HFA 44 MCG INHALER: 44 | 30 days supply | Qty: 11 | Fill #3

## 2017-10-10 MED FILL — FLOVENT HFA 44 MCG INHALER: 44 | 30 days supply | Qty: 11 | Fill #4

## 2017-10-13 MED FILL — SHIPPING COST: 1 days supply | Qty: 1 | Fill #2

## 2017-12-05 ENCOUNTER — Ambulatory Visit (INDEPENDENT_AMBULATORY_CARE_PROVIDER_SITE_OTHER): Payer: 59 | Admitting: Family Medicine

## 2017-12-05 ENCOUNTER — Other Ambulatory Visit: Payer: Self-pay

## 2017-12-05 ENCOUNTER — Encounter: Payer: Self-pay | Admitting: Family Medicine

## 2017-12-05 VITALS — BP 120/62 | HR 80 | Temp 97.9°F | Resp 12 | Ht <= 58 in | Wt 129.0 lb

## 2017-12-05 DIAGNOSIS — N939 Abnormal uterine and vaginal bleeding, unspecified: Secondary | ICD-10-CM | POA: Diagnosis not present

## 2017-12-05 DIAGNOSIS — Z3202 Encounter for pregnancy test, result negative: Secondary | ICD-10-CM | POA: Diagnosis not present

## 2017-12-05 DIAGNOSIS — R945 Abnormal results of liver function studies: Secondary | ICD-10-CM

## 2017-12-05 DIAGNOSIS — R748 Abnormal levels of other serum enzymes: Secondary | ICD-10-CM | POA: Diagnosis not present

## 2017-12-05 DIAGNOSIS — R7989 Other specified abnormal findings of blood chemistry: Secondary | ICD-10-CM

## 2017-12-05 MED FILL — NORETHINDRONE 0.35 MG TAB: 0.35 | 84 days supply | Qty: 84 | Fill #0

## 2017-12-05 MED FILL — SHIPPING COST: 1 days supply | Qty: 1 | Fill #3

## 2017-12-05 NOTE — Patient Instructions (Signed)
F/u PENDING RESULTS  

## 2017-12-05 NOTE — Progress Notes (Signed)
   Subjective:    Patient ID: Carrie Rivera, female    DOB: [birth year], 42 y.o.   MRN: [id number]  Patient presents for Follow-up (has labs from April 2019)   Pt here to f/u labs labs done in April 2019 at physicians today.  Her AST was elevated at 66 ALT 143.  She denies any alcohol wine denies any recent travel.  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  She was seen by her OB/GYN today who has repeated her liver function test.  She was also taken off of Megace.  Is not on any other medications that can contribute to transaminitis.  She feels well has not had any recent illness.    Review Of Systems:  GEN- denies fatigue, fever, weight loss,weakness, recent illness HEENT- denies eye drainage, change in vision, nasal discharge, CVS- denies chest pain, palpitations RESP- denies SOB, cough, wheeze ABD- denies N/V, change in stools, abd pain GU- denies dysuria, hematuria, dribbling, incontinence MSK- denies joint pain, muscle aches, injury Neuro- denies headache, dizziness, syncope, seizure activity       Objective:    BP 120/62   Pulse 80   Temp 97.9 F (36.6 C) (Oral)   Resp 12   Ht  (1.448 m)   Wt 129 lb (58.5 kg)   SpO2 98%   BMI 27.92 kg/m  GEN- NAD, alert and oriented x3 HEENT- PERRL, EOMI, non injected sclera, pink conjunctiva, MMM, oropharynx clear Neck- Supple, no thyromegaly CVS- RRR, no murmur RESP-CTAB ABD-NABS,soft,NT,ND, noHSM Skin- in tact no jaundice  EXT- No edema Pulses- Radial  2+        Assessment & Plan:      Problem List Items Addressed This Visit    None    Visit Diagnoses    Elevated LFTs    -  Primary   F/U labs from her GYN tomorrow, if still elevated proceed with RUQ [place] to evalute liver, no symptoms from transaminitis, possible FLD.      Note: This dictation was prepared with Dragon dictation along with smaller phrase technology. Any transcriptional errors that result from  this process are unintentional.

## 2017-12-06 ENCOUNTER — Encounter: Payer: Self-pay | Admitting: *Deleted

## 2017-12-06 ENCOUNTER — Telehealth: Payer: Self-pay | Admitting: Family Medicine

## 2017-12-06 DIAGNOSIS — Z13220 Encounter for screening for lipoid disorders: Secondary | ICD-10-CM

## 2017-12-06 DIAGNOSIS — R7989 Other specified abnormal findings of blood chemistry: Secondary | ICD-10-CM

## 2017-12-06 DIAGNOSIS — R945 Abnormal results of liver function studies: Principal | ICD-10-CM

## 2017-12-06 NOTE — Telephone Encounter (Signed)
  Called Dr. Richardson Dopp for LFT AST 21 and ALT 19 yesterday No further work up needed   Patient alerted of lab results

## 2017-12-06 NOTE — Telephone Encounter (Signed)
Spoke with patient will recheck in 3 months

## 2017-12-06 NOTE — Addendum Note (Signed)
Addended by: Milinda Antis F on: 12/06/2017 01:12 PM   Modules accepted: Orders

## 2017-12-21 MED FILL — SHIPPING COST: 1 days supply | Qty: 1 | Fill #4

## 2017-12-21 MED FILL — FLOVENT HFA 44 MCG INHALER: 44 | 30 days supply | Qty: 11 | Fill #5

## 2018-03-06 MED FILL — SHIPPING COST: 1 days supply | Qty: 1 | Fill #5

## 2018-03-06 MED FILL — FLOVENT HFA 44 MCG INHALER: 44 | 30 days supply | Qty: 11 | Fill #6

## 2018-03-06 MED FILL — NORETHINDRONE 0.35 MG TAB: 0.35 | 84 days supply | Qty: 84 | Fill #1

## 2018-07-20 ENCOUNTER — Other Ambulatory Visit: Payer: Self-pay | Admitting: *Deleted

## 2018-07-20 MED ORDER — ALBUTEROL SULFATE HFA 108 (90 BASE) MCG/ACT IN AERS: 2.0000 | INHALATION_SPRAY | RESPIRATORY_TRACT | 6 refills | Status: DC | PRN

## 2018-07-20 MED FILL — VENTOLIN HFA 90 MCG INHALER: 108 (90 BAS | 17 days supply | Qty: 18 | Fill #0

## 2018-07-20 MED FILL — SHIPPING COST: 1 days supply | Qty: 1 | Fill #6

## 2018-07-24 ENCOUNTER — Telehealth: Payer: Self-pay | Admitting: Family Medicine

## 2018-07-24 NOTE — Telephone Encounter (Signed)
Pt needs refill on flovent to [facility], pt states it also requires a PA please send in asap.

## 2018-07-25 MED ORDER — FLUTICASONE PROPIONATE HFA 44 MCG/ACT IN AERO: 2.0000 | INHALATION_SPRAY | Freq: Two times a day (BID) | RESPIRATORY_TRACT | 12 refills | Status: DC

## 2018-07-25 MED ORDER — ALBUTEROL SULFATE HFA 108 (90 BASE) MCG/ACT IN AERS: 2.0000 | INHALATION_SPRAY | RESPIRATORY_TRACT | 6 refills | Status: DC | PRN

## 2018-07-25 NOTE — Telephone Encounter (Signed)
Prescription sent to pharmacy.   If PA required, pharmacy will let us know.

## 2019-02-07 ENCOUNTER — Telehealth: Payer: Self-pay | Admitting: Family Medicine

## 2019-02-07 MED ORDER — FLOVENT HFA 44 MCG/ACT IN AERO: 2.0000 | INHALATION_SPRAY | Freq: Two times a day (BID) | RESPIRATORY_TRACT | 3 refills | Status: DC

## 2019-02-07 NOTE — Telephone Encounter (Signed)
Pt needs [place] to send her flovent to [facility] order 26mth supply.

## 2019-02-07 NOTE — Telephone Encounter (Signed)
Prescription sent to pharmacy.

## 2019-06-07 ENCOUNTER — Other Ambulatory Visit: Payer: Self-pay

## 2019-06-08 ENCOUNTER — Ambulatory Visit (INDEPENDENT_AMBULATORY_CARE_PROVIDER_SITE_OTHER): Payer: BLUE CROSS/BLUE SHIELD | Admitting: Family Medicine

## 2019-06-08 ENCOUNTER — Encounter: Payer: Self-pay | Admitting: Family Medicine

## 2019-06-08 VITALS — BP 116/64 | HR 84 | Temp 98.0°F | Resp 14 | Ht <= 58 in | Wt 122.0 lb

## 2019-06-08 DIAGNOSIS — Z0000 Encounter for general adult medical examination without abnormal findings: Secondary | ICD-10-CM

## 2019-06-08 DIAGNOSIS — Z1231 Encounter for screening mammogram for malignant neoplasm of breast: Secondary | ICD-10-CM

## 2019-06-08 DIAGNOSIS — Z0001 Encounter for general adult medical examination with abnormal findings: Secondary | ICD-10-CM | POA: Diagnosis not present

## 2019-06-08 DIAGNOSIS — D509 Iron deficiency anemia, unspecified: Secondary | ICD-10-CM | POA: Diagnosis not present

## 2019-06-08 DIAGNOSIS — J4520 Mild intermittent asthma, uncomplicated: Secondary | ICD-10-CM

## 2019-06-08 MED ORDER — FLOVENT HFA 44 MCG/ACT IN AERO: 2.0000 | INHALATION_SPRAY | Freq: Two times a day (BID) | RESPIRATORY_TRACT | 3 refills | Status: DC

## 2019-06-08 MED ORDER — ALBUTEROL SULFATE HFA 108 (90 BASE) MCG/ACT IN AERS: 2.0000 | INHALATION_SPRAY | RESPIRATORY_TRACT | 3 refills | Status: DC | PRN

## 2019-06-08 NOTE — Assessment & Plan Note (Signed)
Well-controlled with current regimen no changes.

## 2019-06-08 NOTE — Progress Notes (Signed)
Subjective:    Patient ID: Carrie Rivera, female    DOB: [birth year], 43 y.o.   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  She is currently on Flovent she has not had any exacerbations this is working well for her.  She has her albuterol on hand.  She has had her flu shot.   She has history of chronic iron deficiency anemia in the setting of menorrhagia- s/p D/C with myosure placed in 2018.  Her anemia levels improved however she recently had her labs done with doctors today and her hemoglobin returned at 8 with normal platelet and hematocrit.  She has not had any abnormal vaginal bleeding since her procedure she has done 2 Hemoccults on herself which have been negative.  She feels fine she is not overly fatigued    There is no history of any breast cancer colon cancer or known uterine cancer in her family. She does have a few paternal onset had hysterectomy secondary to dysfunctional uterine bleeding but the cause was unknown.  She does not exercise regularly but does monitor her intake    Review Of Systems:  GEN- denies fatigue, fever, weight loss,weakness, recent illness HEENT- denies eye drainage, change in vision, nasal discharge, CVS- denies chest pain, palpitations RESP- denies SOB, cough, wheeze ABD- denies N/V, change in stools, abd pain GU- denies dysuria, hematuria, dribbling, incontinence MSK- denies joint pain, muscle aches, injury Neuro- denies headache, dizziness, syncope, seizure activity       Objective:    BP 116/64   Pulse 84   Temp 98 F (36.7 C) (Temporal)   Resp  14   Ht 4\' 9"  (1.448 m)   Wt 122 lb (55.3 kg)   LMP 06/01/2019 Comment: regular  SpO2 97%   BMI 26.40 kg/m  GEN- NAD, alert and oriented x3 HEENT- PERRL, EOMI, non injected sclera, pink conjunctiva, MMM, oropharynx clear Neck- Supple, no thyromegaly CVS- RRR, no murmur RESP-CTAB ABD-NABS,soft,NT,ND Psych normal affect and mood EXT- No edema Pulses- Radial, DP- 2+        Assessment & Plan:      Problem List Items Addressed This Visit      Unprioritized   Asthma, intermittent    Well-controlled with current regimen no changes.      Iron deficiency anemia    Iron deficiency anemia initially thought that her chronic anemia was secondary to her menorrhagia dysfunctional uterine bleeding that has actually improved and she is still anemic.  We will check her iron TIBC and ferritin.  She is on oral iron supplementation that she recently started. We may need hematology evaluation      Relevant Medications   Iron, Ferrous Sulfate, 142 (45 Fe) MG TBCR   Other Relevant Orders   Iron, TIBC and Ferritin Panel    Other Visit Diagnoses    Routine general medical examination at a health care facility    -  Primary   CPE done.  Patient to schedule mammogram.  Pap smear due in 1 year.  Reviewed fasting labs lipids normal  Note: This dictation was prepared with Dragon dictation along with smaller phrase technology. Any transcriptional errors that result from this process are unintentional.

## 2019-06-08 NOTE — Assessment & Plan Note (Signed)
Iron deficiency anemia initially thought that her chronic anemia was secondary to her menorrhagia dysfunctional uterine bleeding that has actually improved and she is still anemic.  We will check her iron TIBC and ferritin.  She is on oral iron supplementation that she recently started. We may need hematology evaluation

## 2019-06-08 NOTE — Patient Instructions (Addendum)
Schedule your mammogram  We will call with lab results  F/u 1 YEAR

## 2019-06-09 LAB — IRON,TIBC AND FERRITIN PANEL
%SAT: 5 % (calc) — ABNORMAL LOW (ref 16–45)
Ferritin: 15 ng/mL — ABNORMAL LOW (ref 16–232)
Iron: 21 ug/dL — ABNORMAL LOW (ref 40–190)
TIBC: 394 mcg/dL (calc) (ref 250–450)

## 2019-06-11 ENCOUNTER — Encounter: Payer: Self-pay | Admitting: Family Medicine

## 2019-06-15 ENCOUNTER — Other Ambulatory Visit: Payer: Self-pay

## 2019-06-15 MED ORDER — METHYLPREDNISOLONE 4 MG PO TBPK: ORAL_TABLET | ORAL | 0 refills | Status: DC

## 2019-06-18 ENCOUNTER — Encounter: Payer: Self-pay | Admitting: Family Medicine

## 2019-06-18 ENCOUNTER — Other Ambulatory Visit: Payer: Self-pay

## 2019-06-18 ENCOUNTER — Ambulatory Visit (INDEPENDENT_AMBULATORY_CARE_PROVIDER_SITE_OTHER): Payer: BLUE CROSS/BLUE SHIELD | Admitting: Family Medicine

## 2019-06-18 VITALS — BP 108/66 | HR 86 | Temp 98.0°F | Resp 14 | Ht <= 58 in | Wt 122.0 lb

## 2019-06-18 DIAGNOSIS — M5030 Other cervical disc degeneration, unspecified cervical region: Secondary | ICD-10-CM

## 2019-06-18 DIAGNOSIS — M5020 Other cervical disc displacement, unspecified cervical region: Secondary | ICD-10-CM

## 2019-06-18 DIAGNOSIS — G542 Cervical root disorders, not elsewhere classified: Secondary | ICD-10-CM | POA: Diagnosis not present

## 2019-06-18 NOTE — Patient Instructions (Signed)
MRI of C spine to be done  F/U pending results
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
## 2019-06-22 ENCOUNTER — Ambulatory Visit (HOSPITAL_COMMUNITY): Payer: BC Managed Care – PPO

## 2019-06-26 ENCOUNTER — Encounter: Payer: Self-pay | Admitting: Family Medicine

## 2019-06-28 ENCOUNTER — Encounter: Payer: Self-pay | Admitting: Family Medicine

## 2019-07-09 ENCOUNTER — Encounter: Payer: Self-pay | Admitting: Family Medicine

## 2019-07-11 ENCOUNTER — Ambulatory Visit (HOSPITAL_COMMUNITY): Payer: BC Managed Care – PPO

## 2019-07-13 ENCOUNTER — Other Ambulatory Visit: Payer: Self-pay

## 2019-07-13 ENCOUNTER — Ambulatory Visit (HOSPITAL_COMMUNITY)
Admission: RE | Admit: 2019-07-13 | Discharge: 2019-07-13 | Disposition: A | Discharge status: Home / Self Care | Payer: BC Managed Care – PPO | Source: Ambulatory Visit | Attending: Family Medicine | Admitting: Family Medicine

## 2019-07-13 DIAGNOSIS — Z1231 Encounter for screening mammogram for malignant neoplasm of breast: Secondary | ICD-10-CM | POA: Insufficient documentation

## 2019-07-16 ENCOUNTER — Other Ambulatory Visit (HOSPITAL_COMMUNITY): Payer: Self-pay | Admitting: Family Medicine

## 2019-07-16 DIAGNOSIS — R928 Other abnormal and inconclusive findings on diagnostic imaging of breast: Secondary | ICD-10-CM

## 2019-08-07 ENCOUNTER — Other Ambulatory Visit: Payer: Self-pay

## 2019-08-07 ENCOUNTER — Ambulatory Visit (HOSPITAL_COMMUNITY)
Admission: RE | Admit: 2019-08-07 | Discharge: 2019-08-07 | Disposition: A | Discharge status: Home / Self Care | Payer: BC Managed Care – PPO | Source: Ambulatory Visit | Attending: Family Medicine | Admitting: Family Medicine

## 2019-08-07 DIAGNOSIS — N6489 Other specified disorders of breast: Secondary | ICD-10-CM | POA: Diagnosis not present

## 2019-08-07 DIAGNOSIS — R928 Other abnormal and inconclusive findings on diagnostic imaging of breast: Secondary | ICD-10-CM | POA: Diagnosis not present

## 2019-08-07 DIAGNOSIS — R922 Inconclusive mammogram: Secondary | ICD-10-CM | POA: Diagnosis not present

## 2019-08-08 ENCOUNTER — Encounter: Payer: Self-pay | Admitting: Family Medicine

## 2019-08-08 ENCOUNTER — Other Ambulatory Visit: Payer: Self-pay | Admitting: Family Medicine

## 2019-08-08 DIAGNOSIS — N6489 Other specified disorders of breast: Secondary | ICD-10-CM

## 2019-08-14 ENCOUNTER — Encounter (HOSPITAL_COMMUNITY): Payer: BC Managed Care – PPO

## 2019-08-14 ENCOUNTER — Other Ambulatory Visit (HOSPITAL_COMMUNITY): Payer: BC Managed Care – PPO

## 2019-08-15 ENCOUNTER — Encounter: Payer: Self-pay | Admitting: Family Medicine

## 2019-08-15 MED ORDER — LORAZEPAM 1 MG PO TABS: 1.0000 mg | ORAL_TABLET | Freq: Two times a day (BID) | ORAL | 0 refills | Status: DC | PRN

## 2019-08-20 ENCOUNTER — Ambulatory Visit
Admission: RE | Admit: 2019-08-20 | Discharge: 2019-08-20 | Disposition: A | Discharge status: Home / Self Care | Payer: BC Managed Care – PPO | Source: Ambulatory Visit | Attending: Family Medicine | Admitting: Family Medicine

## 2019-08-20 ENCOUNTER — Other Ambulatory Visit: Payer: Self-pay

## 2019-08-20 DIAGNOSIS — R928 Other abnormal and inconclusive findings on diagnostic imaging of breast: Secondary | ICD-10-CM | POA: Diagnosis not present

## 2019-08-20 DIAGNOSIS — N6489 Other specified disorders of breast: Secondary | ICD-10-CM

## 2019-08-20 DIAGNOSIS — N6011 Diffuse cystic mastopathy of right breast: Secondary | ICD-10-CM | POA: Diagnosis not present

## 2019-09-12 ENCOUNTER — Ambulatory Visit: Payer: Self-pay | Admitting: General Surgery

## 2019-09-12 DIAGNOSIS — N6021 Fibroadenosis of right breast: Secondary | ICD-10-CM

## 2019-10-05 ENCOUNTER — Encounter: Payer: Self-pay | Admitting: Family Medicine

## 2019-10-05 DIAGNOSIS — N6021 Fibroadenosis of right breast: Secondary | ICD-10-CM

## 2019-10-05 DIAGNOSIS — R928 Other abnormal and inconclusive findings on diagnostic imaging of breast: Secondary | ICD-10-CM

## 2019-10-05 DIAGNOSIS — N6489 Other specified disorders of breast: Secondary | ICD-10-CM

## 2019-11-12 DIAGNOSIS — Z4689 Encounter for fitting and adjustment of other specified devices: Secondary | ICD-10-CM | POA: Diagnosis not present

## 2019-11-13 DIAGNOSIS — R928 Other abnormal and inconclusive findings on diagnostic imaging of breast: Secondary | ICD-10-CM | POA: Diagnosis not present

## 2019-11-16 DIAGNOSIS — N6011 Diffuse cystic mastopathy of right breast: Secondary | ICD-10-CM | POA: Diagnosis not present

## 2019-11-16 DIAGNOSIS — D241 Benign neoplasm of right breast: Secondary | ICD-10-CM | POA: Diagnosis not present

## 2019-11-16 DIAGNOSIS — N6489 Other specified disorders of breast: Secondary | ICD-10-CM | POA: Diagnosis not present

## 2019-11-16 DIAGNOSIS — N6021 Fibroadenosis of right breast: Secondary | ICD-10-CM | POA: Diagnosis not present

## 2019-11-16 DIAGNOSIS — N6091 Unspecified benign mammary dysplasia of right breast: Secondary | ICD-10-CM | POA: Diagnosis not present

## 2019-12-21 DIAGNOSIS — R922 Inconclusive mammogram: Secondary | ICD-10-CM | POA: Diagnosis not present

## 2019-12-21 DIAGNOSIS — N6489 Other specified disorders of breast: Secondary | ICD-10-CM | POA: Diagnosis not present

## 2019-12-21 DIAGNOSIS — R928 Other abnormal and inconclusive findings on diagnostic imaging of breast: Secondary | ICD-10-CM | POA: Diagnosis not present

## 2019-12-21 DIAGNOSIS — Z01818 Encounter for other preprocedural examination: Secondary | ICD-10-CM | POA: Diagnosis not present

## 2019-12-28 ENCOUNTER — Encounter: Payer: Self-pay | Admitting: Family Medicine

## 2020-01-09 ENCOUNTER — Encounter: Payer: Self-pay | Admitting: Family Medicine

## 2020-01-22 DIAGNOSIS — K5903 Drug induced constipation: Secondary | ICD-10-CM | POA: Diagnosis not present

## 2020-01-22 DIAGNOSIS — N6021 Fibroadenosis of right breast: Secondary | ICD-10-CM | POA: Diagnosis not present

## 2020-01-22 DIAGNOSIS — D241 Benign neoplasm of right breast: Secondary | ICD-10-CM | POA: Diagnosis not present

## 2020-01-22 DIAGNOSIS — N6489 Other specified disorders of breast: Secondary | ICD-10-CM | POA: Diagnosis not present

## 2020-01-22 DIAGNOSIS — Z79899 Other long term (current) drug therapy: Secondary | ICD-10-CM | POA: Diagnosis not present

## 2020-01-22 DIAGNOSIS — D509 Iron deficiency anemia, unspecified: Secondary | ICD-10-CM | POA: Diagnosis not present

## 2020-01-22 DIAGNOSIS — R928 Other abnormal and inconclusive findings on diagnostic imaging of breast: Secondary | ICD-10-CM | POA: Diagnosis not present

## 2020-01-22 DIAGNOSIS — J4520 Mild intermittent asthma, uncomplicated: Secondary | ICD-10-CM | POA: Diagnosis not present

## 2020-01-22 DIAGNOSIS — N6011 Diffuse cystic mastopathy of right breast: Secondary | ICD-10-CM | POA: Diagnosis not present

## 2020-01-22 HISTORY — PX: BREAST EXCISIONAL BIOPSY: SUR124

## 2020-04-03 ENCOUNTER — Encounter: Payer: Self-pay | Admitting: Family Medicine

## 2020-04-03 DIAGNOSIS — Z1231 Encounter for screening mammogram for malignant neoplasm of breast: Secondary | ICD-10-CM

## 2020-04-04 MED ORDER — NORETHIN ACE-ETH ESTRAD-FE 1-20 MG-MCG PO TABS: 1.0000 | ORAL_TABLET | Freq: Every day | ORAL | 3 refills | Status: DC

## 2020-04-04 NOTE — Telephone Encounter (Signed)
Loestrin please resend to [facility] order and fax to [facility]

## 2020-04-04 NOTE — Addendum Note (Signed)
Addended by: Phillips Odor on: 04/04/2020 03:10 PM   Modules accepted: Orders

## 2020-04-25 ENCOUNTER — Ambulatory Visit: Payer: BC Managed Care – PPO

## 2020-04-25 ENCOUNTER — Other Ambulatory Visit: Payer: Self-pay

## 2020-06-13 ENCOUNTER — Other Ambulatory Visit: Payer: Self-pay

## 2020-06-13 ENCOUNTER — Ambulatory Visit (INDEPENDENT_AMBULATORY_CARE_PROVIDER_SITE_OTHER): Payer: BC Managed Care – PPO

## 2020-06-13 DIAGNOSIS — Z23 Encounter for immunization: Secondary | ICD-10-CM

## 2020-06-24 NOTE — Progress Notes (Signed)
   Covid-19 Vaccination Clinic  Name:  Carrie Rivera    MRN: [id number] DOB: July 05, 1976  06/24/2020  Ms. [name] was observed post Covid-19 immunization for 15 minutes without incident. She was provided with Vaccine Information Sheet and instruction to access the V-Safe system.   Ms. [name] was instructed to call 911 with any severe reactions post vaccine: [name] Kitchen Difficulty breathing  . Swelling of face and throat  . A fast heartbeat  . A bad rash all over body  . Dizziness and weakness   Immunizations Administered    Name Date Dose VIS Date Route   Pfizer COVID-19 Vaccine 06/13/2020  3:00 PM 0.3 mL 05/14/2020 Intramuscular   Manufacturer: [employer], [employer]   Lot: [id number]   NDC: [address]-2

## 2020-06-24 NOTE — Addendum Note (Signed)
Addended by: Johny Drilling on: 06/24/2020 08:27 AM   Modules accepted: Level of Service

## 2020-06-27 ENCOUNTER — Encounter: Payer: Self-pay | Admitting: Family Medicine

## 2020-06-27 ENCOUNTER — Ambulatory Visit (INDEPENDENT_AMBULATORY_CARE_PROVIDER_SITE_OTHER): Payer: BC Managed Care – PPO | Admitting: Family Medicine

## 2020-06-27 ENCOUNTER — Encounter: Payer: BLUE CROSS/BLUE SHIELD | Admitting: Family Medicine

## 2020-06-27 ENCOUNTER — Other Ambulatory Visit: Payer: Self-pay

## 2020-06-27 VITALS — BP 122/58 | HR 82 | Temp 98.1°F | Resp 14 | Ht <= 58 in | Wt 122.0 lb

## 2020-06-27 DIAGNOSIS — E559 Vitamin D deficiency, unspecified: Secondary | ICD-10-CM | POA: Diagnosis not present

## 2020-06-27 DIAGNOSIS — J4520 Mild intermittent asthma, uncomplicated: Secondary | ICD-10-CM | POA: Diagnosis not present

## 2020-06-27 DIAGNOSIS — N921 Excessive and frequent menstruation with irregular cycle: Secondary | ICD-10-CM

## 2020-06-27 DIAGNOSIS — Z124 Encounter for screening for malignant neoplasm of cervix: Secondary | ICD-10-CM

## 2020-06-27 DIAGNOSIS — Z1159 Encounter for screening for other viral diseases: Secondary | ICD-10-CM | POA: Diagnosis not present

## 2020-06-27 DIAGNOSIS — D509 Iron deficiency anemia, unspecified: Secondary | ICD-10-CM

## 2020-06-27 DIAGNOSIS — Z23 Encounter for immunization: Secondary | ICD-10-CM | POA: Diagnosis not present

## 2020-06-27 DIAGNOSIS — Z0001 Encounter for general adult medical examination with abnormal findings: Secondary | ICD-10-CM | POA: Diagnosis not present

## 2020-06-27 DIAGNOSIS — Z0000 Encounter for general adult medical examination without abnormal findings: Secondary | ICD-10-CM

## 2020-06-27 MED ORDER — ALBUTEROL SULFATE HFA 108 (90 BASE) MCG/ACT IN AERS: 2.0000 | INHALATION_SPRAY | RESPIRATORY_TRACT | 4 refills | Status: DC | PRN

## 2020-06-27 MED ORDER — FLOVENT HFA 44 MCG/ACT IN AERO: 2.0000 | INHALATION_SPRAY | Freq: Two times a day (BID) | RESPIRATORY_TRACT | 3 refills | Status: DC

## 2020-06-27 MED ORDER — NORETHIN ACE-ETH ESTRAD-FE 1-20 MG-MCG PO TABS: 1.0000 | ORAL_TABLET | Freq: Every day | ORAL | 11 refills | Status: DC

## 2020-06-27 NOTE — Patient Instructions (Signed)
We will send results via mychart TDAP given F/U 1 year for Physical   Dr. Lezlie Octave- [facility] - Dr. Allena Katz Dr. Margo Aye in [place] [facility]

## 2020-06-27 NOTE — Assessment & Plan Note (Signed)
Refilled flovent no recent exacerbations

## 2020-06-27 NOTE — Progress Notes (Signed)
Subjective:    Patient ID: Carrie Rivera, female    DOB: 10/30/1975, 44 y.o.   MRN: [id number]  Patient presents for Gynecologic Exam (is fasting) Patient here for CPE medications reviewed. Pt is a local pediatrician  Asthma she is controlled on Flovent and albuterol as needed  Iron deficiency anemia she is on iron supplementation twice daily  Mammogram up-to-date followed by [facility] due to history of abnormality.  She did have a lumpectomy performed of the abnormality back in July (PASH)  but it was not cancerous.  She is on Loestrin secondary to irregular menses and dysmenorrhea. Her cycles are much better now She has long standing history of menstrual cycle issues, has had procedures with GYN  She is due for Pap smear  Immunizations- Flu shot,  COVID-19 vaccine   Discussed HEP C screening  Due for fasting labs  LMP currently at end of menses  UTD with dentist   Review Of Systems:  GEN- denies fatigue, fever, weight loss,weakness, recent illness HEENT- denies eye drainage, change in vision, nasal discharge, CVS- denies chest pain, palpitations RESP- denies SOB, cough, wheeze ABD- denies N/V, change in stools, abd pain GU- denies dysuria, hematuria, dribbling, incontinence MSK- denies joint pain, muscle aches, injury Neuro- denies headache, dizziness, syncope, seizure activity       Objective:    BP (!) 122/58   Pulse 82   Temp 98.1 F (36.7 C) (Temporal)   Resp 14   Ht 4\' 9"  (1.448 m)   Wt 122 lb (55.3 kg)   LMP 06/16/2020 Comment: regular  SpO2 97%   BMI 26.40 kg/m  GEN- NAD, alert and oriented x3 HEENT- PERRL, EOMI, non injected sclera, pink conjunctiva, MMM, oropharynx clear Neck- Supple, no thyromegaly CVS- RRR, no murmur RESP-CTAB ABD-NABS,soft,NT,ND GU- normal external genitalia, vaginal mucosa pink and moist, cervix visualized no growth, + blood form os, no  discharge, no CMT, no ovarian masses, uterus normal size Psych normal affect and  mood  EXT- No edema Pulses- Radial, DP- 2+    FALL/DEPRESSION/AUDIT C negative     Assessment & Plan:      Problem List Items Addressed This Visit      Unprioritized   Asthma, intermittent    Refilled flovent no recent exacerbations       Relevant Medications   fluticasone (FLOVENT HFA) 44 MCG/ACT inhaler   albuterol (VENTOLIN HFA) 108 (90 Base) MCG/ACT inhaler   Iron deficiency anemia    Recheck iron levels, continue supplement OCP to help regulate Mammogram non cancerous lesion       Relevant Orders   Iron, TIBC and Ferritin Panel   Menorrhagia    Other Visit Diagnoses    Routine general medical examination at a health care facility    -  Primary   CPE done, PAP smear done, TDAP given, increase physical activity    Relevant Orders   CBC with Differential/Platelet   Lipid panel   Comprehensive metabolic panel   Hepatitis C antibody   TSH   Tdap vaccine greater than or equal to 7yo IM (Completed)   Cervical cancer screening       Relevant Orders   Pap IG w/ reflex to HPV when ASC-U   Vitamin D deficiency       Relevant Orders   Vitamin D, 25-hydroxy   Need for hepatitis C screening test       Relevant Orders   Hepatitis C antibody   Need for tetanus, diphtheria, and  acellular pertussis (Tdap) vaccine in patient of adolescent age or older       Relevant Orders   Tdap vaccine greater than or equal to 7yo IM (Completed)      Note: This dictation was prepared with Dragon dictation along with smaller phrase technology. Any transcriptional errors that result from this process are unintentional.

## 2020-06-27 NOTE — Assessment & Plan Note (Signed)
Recheck iron levels, continue supplement OCP to help regulate Mammogram non cancerous lesion

## 2020-06-30 LAB — CBC WITH DIFFERENTIAL/PLATELET
Absolute Monocytes: 451 cells/uL (ref 200–950)
Basophils Absolute: 73 cells/uL (ref 0–200)
Basophils Relative: 1.2 %
Eosinophils Absolute: 250 cells/uL (ref 15–500)
Eosinophils Relative: 4.1 %
HCT: 38.5 % (ref 35.0–45.0)
Hemoglobin: 12.2 g/dL (ref 11.7–15.5)
Lymphs Abs: 970 cells/uL (ref 850–3900)
MCH: 30.1 pg (ref 27.0–33.0)
MCHC: 31.7 g/dL — ABNORMAL LOW (ref 32.0–36.0)
MCV: 95.1 fL (ref 80.0–100.0)
MPV: 11 fL (ref 7.5–12.5)
Monocytes Relative: 7.4 %
Neutro Abs: 4355 cells/uL (ref 1500–7800)
Neutrophils Relative %: 71.4 %
Platelets: 288 10*3/uL (ref 140–400)
RBC: 4.05 10*6/uL (ref 3.80–5.10)
RDW: 12.3 % (ref 11.0–15.0)
Total Lymphocyte: 15.9 %
WBC: 6.1 10*3/uL (ref 3.8–10.8)

## 2020-06-30 LAB — COMPREHENSIVE METABOLIC PANEL
AG Ratio: 1.3 (calc) (ref 1.0–2.5)
ALT: 31 U/L — ABNORMAL HIGH (ref 6–29)
AST: 23 U/L (ref 10–35)
Albumin: 4 g/dL (ref 3.6–5.1)
Alkaline phosphatase (APISO): 53 U/L (ref 31–125)
BUN: 15 mg/dL (ref 7–25)
CO2: 22 mmol/L (ref 20–32)
Calcium: 9.6 mg/dL (ref 8.6–10.2)
Chloride: 106 mmol/L (ref 98–110)
Creat: 0.71 mg/dL (ref 0.50–1.10)
Globulin: 3 g/dL (calc) (ref 1.9–3.7)
Glucose, Bld: 82 mg/dL (ref 65–99)
Potassium: 4.8 mmol/L (ref 3.5–5.3)
Sodium: 142 mmol/L (ref 135–146)
Total Bilirubin: 0.3 mg/dL (ref 0.2–1.2)
Total Protein: 7 g/dL (ref 6.1–8.1)

## 2020-06-30 LAB — IRON,TIBC AND FERRITIN PANEL
%SAT: 8 % (calc) — ABNORMAL LOW (ref 16–45)
Ferritin: 22 ng/mL (ref 16–232)
Iron: 39 ug/dL — ABNORMAL LOW (ref 40–190)
TIBC: 480 mcg/dL (calc) — ABNORMAL HIGH (ref 250–450)

## 2020-06-30 LAB — TSH: TSH: 0.97 mIU/L

## 2020-06-30 LAB — LIPID PANEL
Cholesterol: 149 mg/dL (ref <200)
HDL: 50 mg/dL (ref >=50)
LDL Cholesterol (Calc): 82 mg/dL (calc)
Non-HDL Cholesterol (Calc): 99 mg/dL (calc) (ref <130)
Total CHOL/HDL Ratio: 3 (calc) (ref <5.0)
Triglycerides: 87 mg/dL (ref <150)

## 2020-06-30 LAB — HEPATITIS C ANTIBODY
Hepatitis C Ab: NONREACTIVE
SIGNAL TO CUT-OFF: 0.01 (ref <1.00)

## 2020-06-30 LAB — VITAMIN D 25 HYDROXY (VIT D DEFICIENCY, FRACTURES): Vit D, 25-Hydroxy: 62 ng/mL (ref 30–100)

## 2020-07-01 LAB — PAP IG W/ RFLX HPV ASCU

## 2020-07-02 DIAGNOSIS — R928 Other abnormal and inconclusive findings on diagnostic imaging of breast: Secondary | ICD-10-CM | POA: Diagnosis not present

## 2020-07-02 DIAGNOSIS — N6322 Unspecified lump in the left breast, upper inner quadrant: Secondary | ICD-10-CM | POA: Diagnosis not present

## 2020-07-02 DIAGNOSIS — Z1231 Encounter for screening mammogram for malignant neoplasm of breast: Secondary | ICD-10-CM | POA: Diagnosis not present

## 2020-07-02 DIAGNOSIS — N6325 Unspecified lump in the left breast, overlapping quadrants: Secondary | ICD-10-CM | POA: Diagnosis not present

## 2020-07-02 DIAGNOSIS — N6002 Solitary cyst of left breast: Secondary | ICD-10-CM | POA: Diagnosis not present

## 2020-07-02 LAB — HM MAMMOGRAPHY: HM Mammogram: ABNORMAL — AB (ref 0–4)

## 2020-12-11 DIAGNOSIS — N6321 Unspecified lump in the left breast, upper outer quadrant: Secondary | ICD-10-CM | POA: Diagnosis not present

## 2020-12-11 DIAGNOSIS — N6320 Unspecified lump in the left breast, unspecified quadrant: Secondary | ICD-10-CM | POA: Diagnosis not present

## 2020-12-15 DIAGNOSIS — N641 Fat necrosis of breast: Secondary | ICD-10-CM | POA: Diagnosis not present

## 2020-12-15 DIAGNOSIS — N6032 Fibrosclerosis of left breast: Secondary | ICD-10-CM | POA: Diagnosis not present

## 2020-12-15 DIAGNOSIS — N6321 Unspecified lump in the left breast, upper outer quadrant: Secondary | ICD-10-CM | POA: Diagnosis not present

## 2021-04-10 IMAGING — US US BREAST*R* LIMITED INC AXILLA
1 series · 10 of 10 positions shown · non-contrast
Comparison: Previous exam(s).

CLINICAL DATA: Screening recall for possible right breast
distortion.

EXAM:
DIGITAL DIAGNOSTIC UNILATERAL RIGHT MAMMOGRAM WITH CAD AND TOMO
RIGHT BREAST ULTRASOUND

[Series 1: us breast*right* limited inc axilla · 0.07mm/px · 10 of 10 slices shown]
[im 1/10]
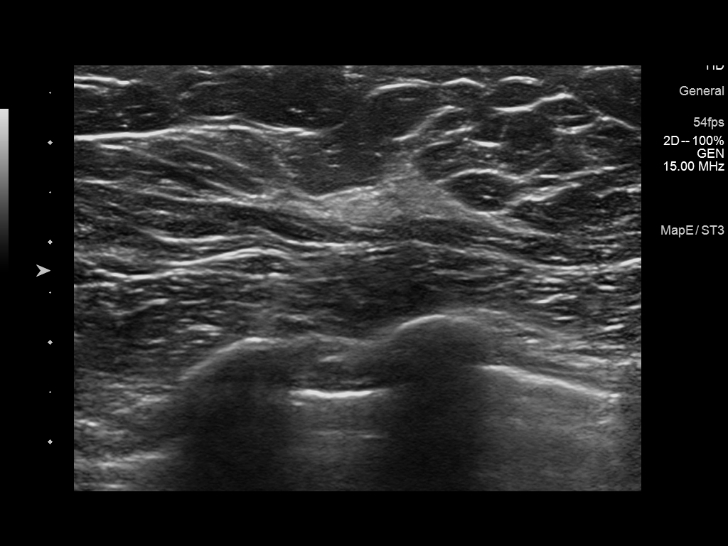
[im 2/10]
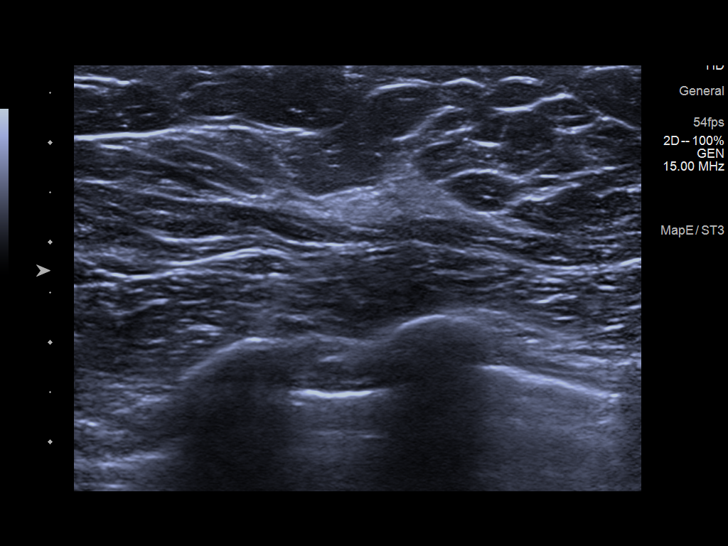
[im 3/10]
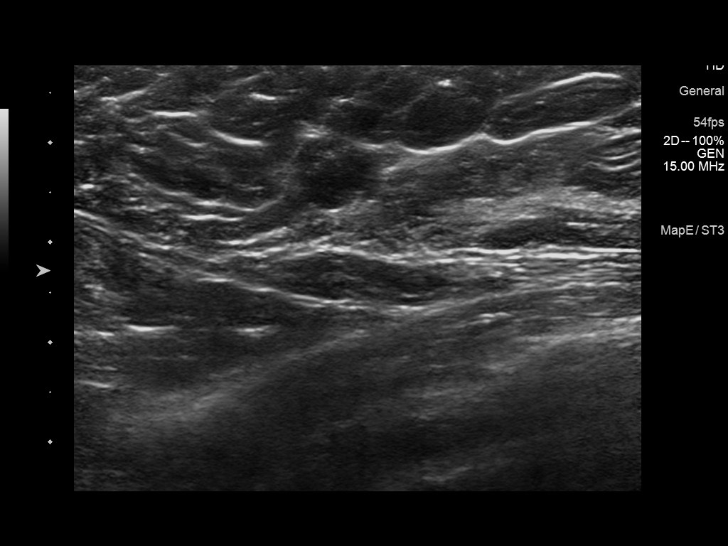
[im 4/10]
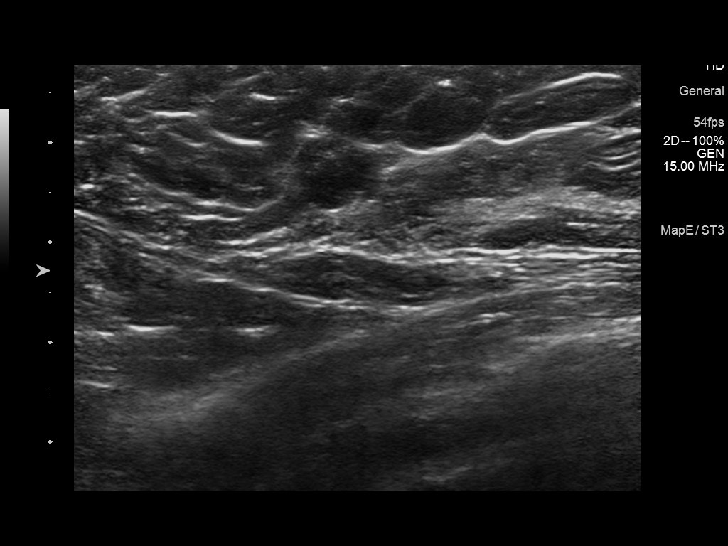
[im 5/10]
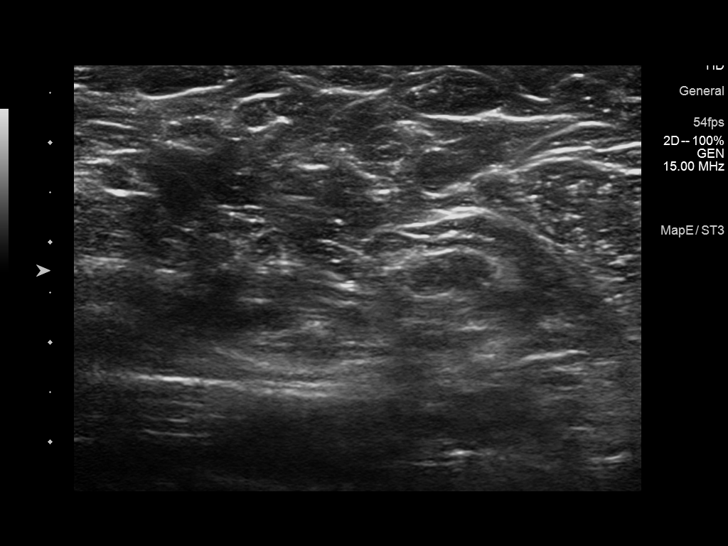
[im 6/10]
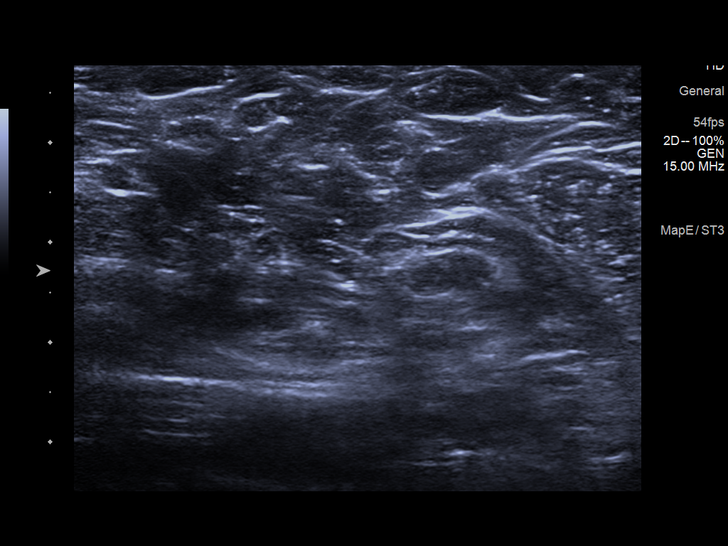
[im 7/10]
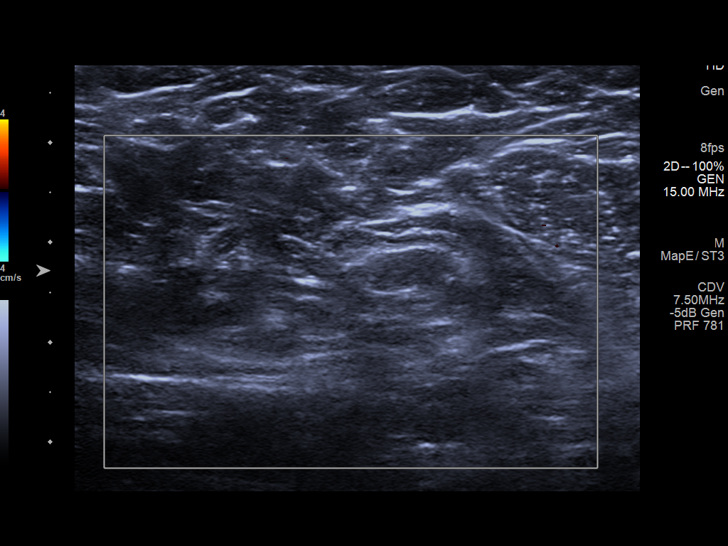
[im 8/10]
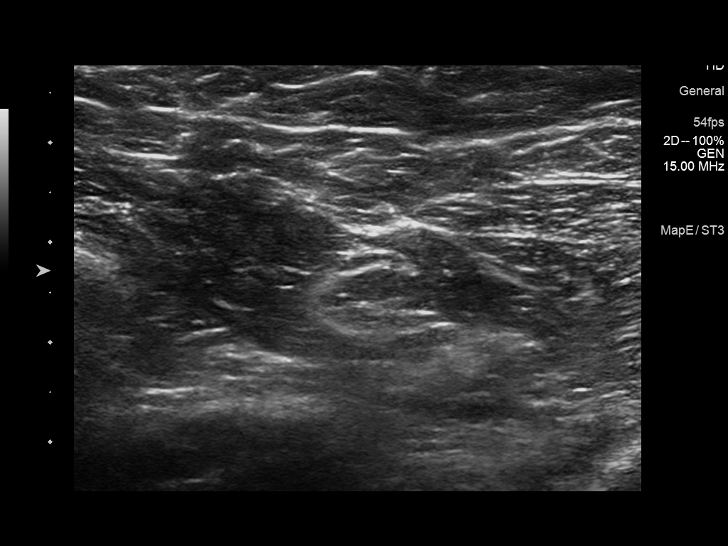
[im 9/10]
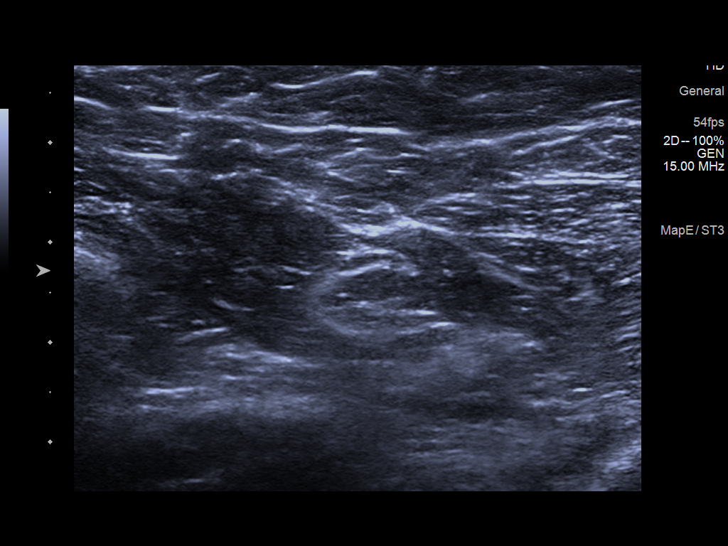
[im 10/10]
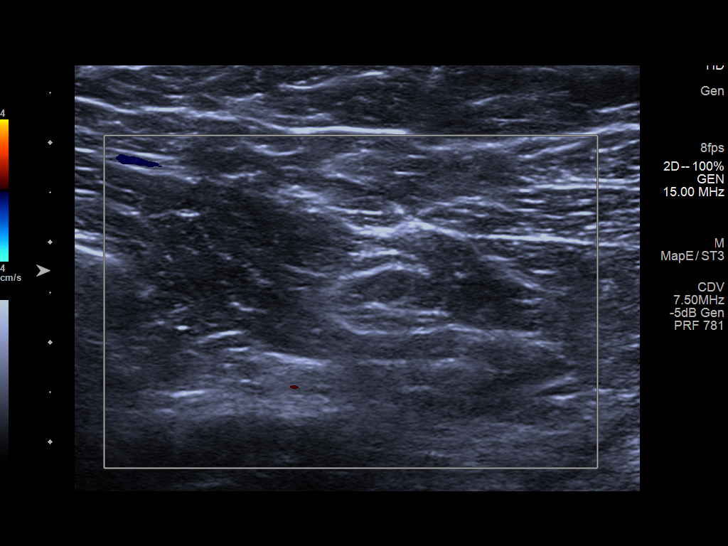

[10 of 10 positions shown; findings below may reference images not displayed]

ACR Breast Density Category c: The breast tissue is heterogeneously
dense, which may obscure small masses.
FINDINGS: Spot compression tomograms were performed over the upper-outer right
breast. There is a persistent subtle area of distortion within the
upper-outer right breast, which is felt to persist on both the spot
compression cc and MLO tomograms.

Mammographic images were processed with CAD.

Targeted ultrasound of the upper-outer right breast was performed.
There is no definite sonographic correlate for the distortion seen
in the upper-outer right breast at mammography. Several small cysts
and areas of fibrocystic change are identified. No lymphadenopathy
seen in the right axilla.
IMPRESSION: Suspicious right breast distortion.

RECOMMENDATION:
Stereotactic guided biopsy of the distortion in the upper-outer
right breast is recommended.

I have discussed the findings and recommendations with the patient.
If applicable, a reminder letter will be sent to the patient
regarding the next appointment.

BI-RADS CATEGORY  4: Suspicious.

## 2021-04-23 IMAGING — MG MM BREAST BX W/ LOC DEV 1ST LESION IMAGE BX SPEC STEREO GUIDE*R*
8 of 17 series · 8 of 37 positions shown · non-contrast
Comparison: Previous exam(s).
COMPARISON: Previous exam(s).

Addendum:
CLINICAL DATA: Biopsy right breast distortion

EXAM:
ULTRASOUND GUIDED RIGHT BREAST CORE NEEDLE BIOPSY

[R (1 of 8)]
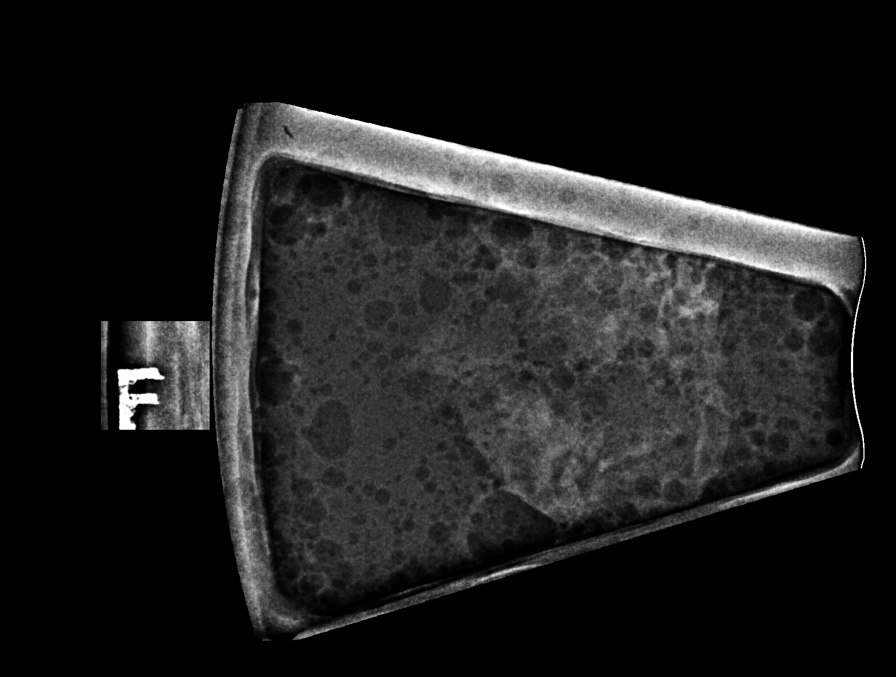

[R (2 of 8)]
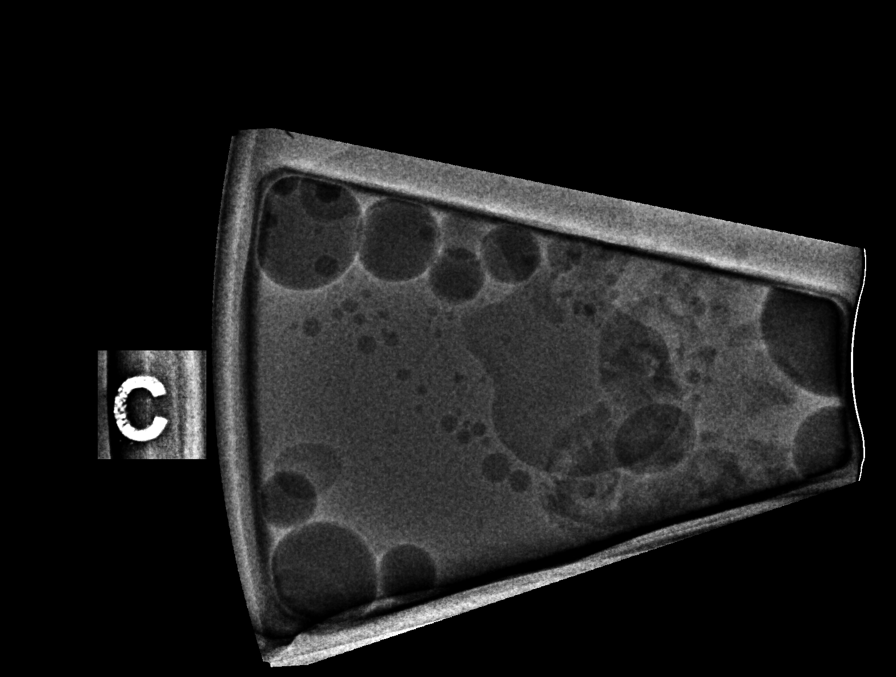

[R (3 of 8)]
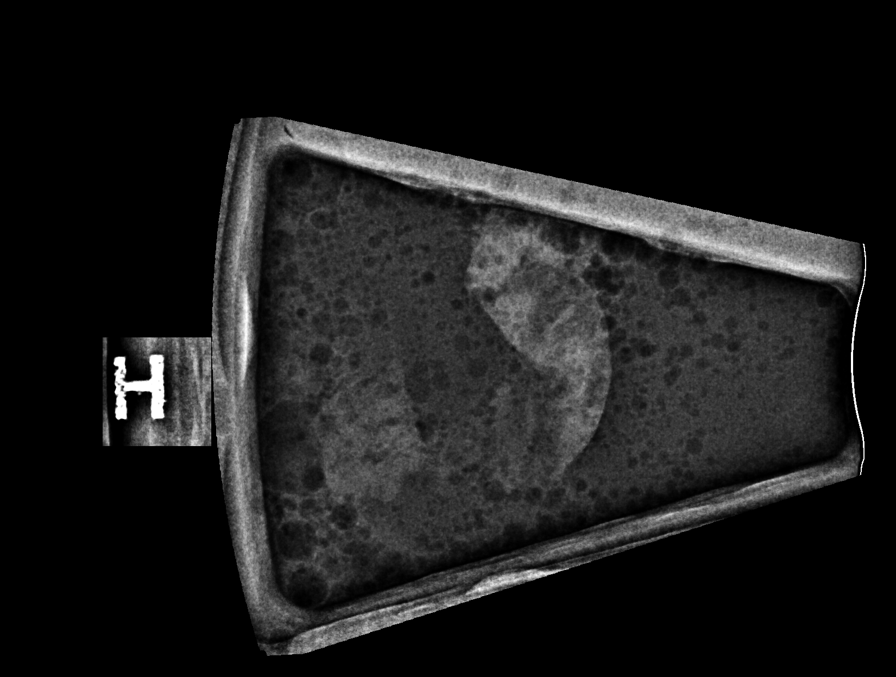

[R (4 of 8)]
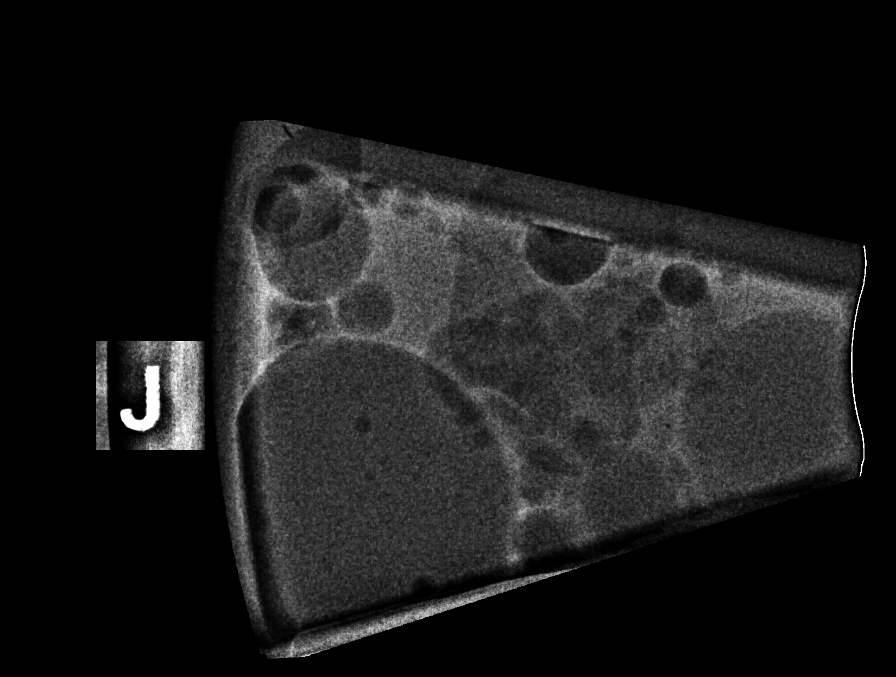

[R (5 of 8)]
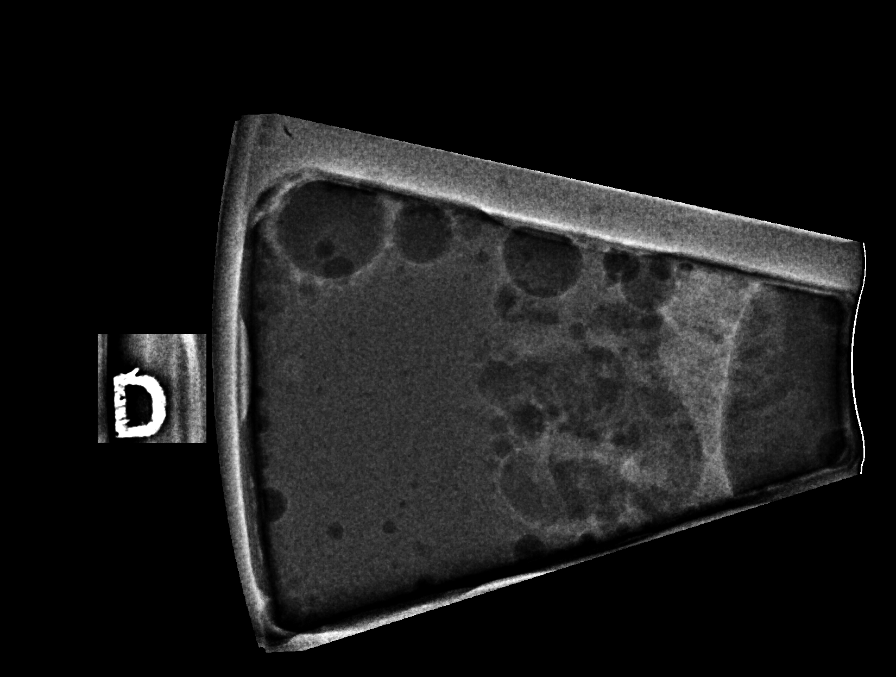

[R (6 of 8)]
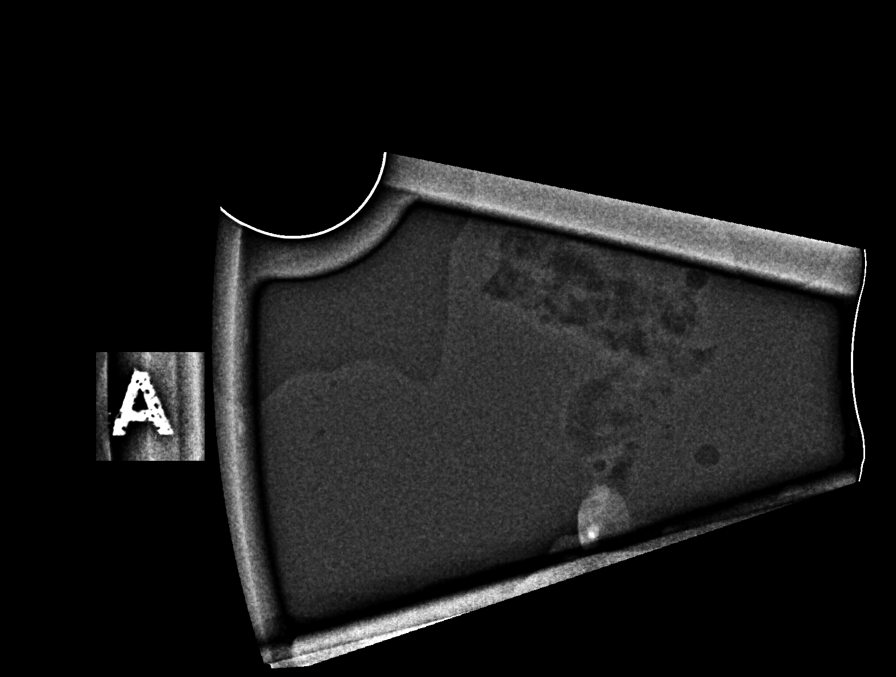

[R (7 of 8)]
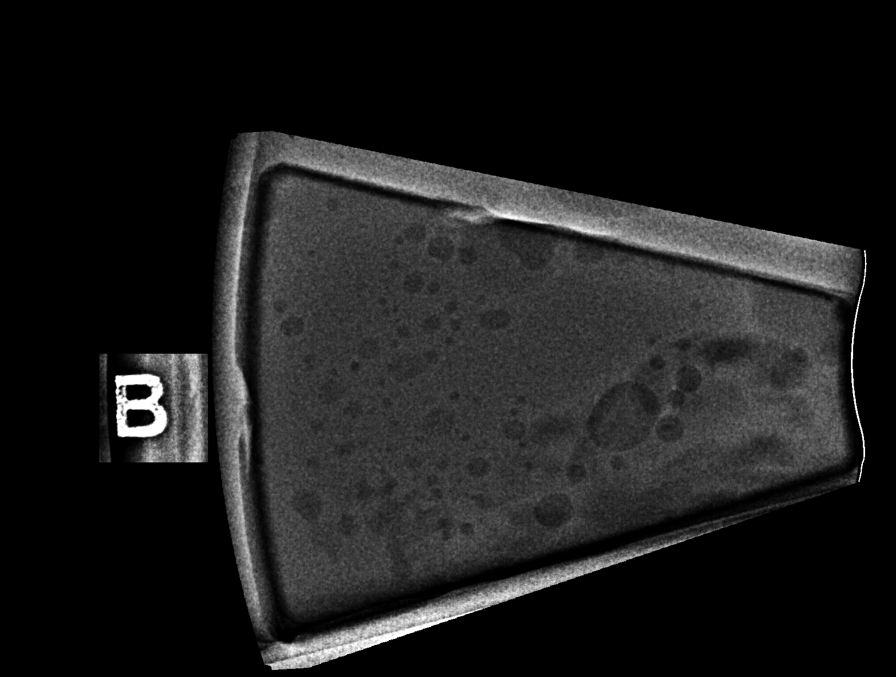

[R (8 of 8)]
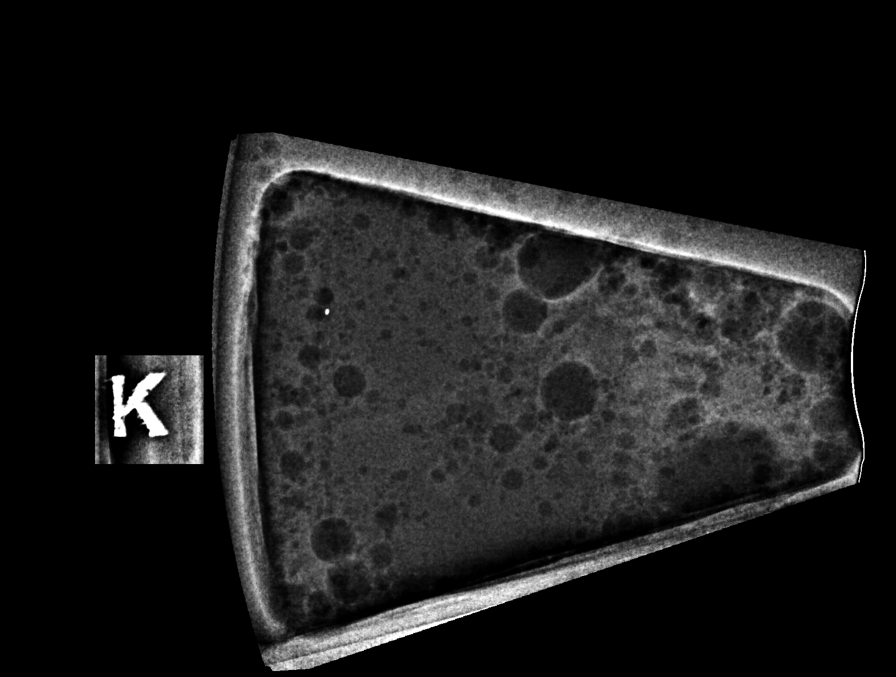

[8 of 37 positions shown; findings below may reference images not displayed]



Lesion quadrant: Upper-outer

Using sterile technique and 1% Lidocaine as local anesthetic, under
direct ultrasound visualization, a 12 gauge Uljind device was
used to perform biopsy of distortion in the upper outer right breast
using a superior approach. At the conclusion of the procedure coil
shaped tissue marker clip was deployed into the biopsy cavity.
Follow up 2 view mammogram was performed and dictated separately.
IMPRESSION: Ultrasound guided biopsy of distortion in the upper outer right
breast. No apparent complications.

ADDENDUM:
Pathology revealed FIBROCYSTIC CHANGES WITH SCLEROSING ADENOSIS AND
CALCIFICATIONS. PSEUDOANGIOMATOUS STROMAL HYPERPLASIA of the RIGHT
breast, upper outer. This was found to be discordant by Dr. Magdalena
Naier, with excision recommended.

Pathology results were discussed with the patient by telephone with
Dr. Rushann Londel. The patient reported doing well after the biopsy
with tenderness at the site. Post biopsy instructions and care were
reviewed and questions were answered. The patient was encouraged to
call The [REDACTED] for any additional
concerns.

Surgical consultation has been arranged with Dr. Agbotah Henaku at
[REDACTED] on September 05, 2019, per patient request.

Pathology results reported by Naotomo Evellyn RN on 08/22/2019.



Lesion quadrant: Upper-outer

Using sterile technique and 1% Lidocaine as local anesthetic, under
direct ultrasound visualization, a 12 gauge Uljind device was
used to perform biopsy of distortion in the upper outer right breast
using a superior approach. At the conclusion of the procedure coil
shaped tissue marker clip was deployed into the biopsy cavity.
Follow up 2 view mammogram was performed and dictated separately.
IMPRESSION: Ultrasound guided biopsy of distortion in the upper outer right
breast. No apparent complications.

## 2021-05-25 DIAGNOSIS — Z20822 Contact with and (suspected) exposure to covid-19: Secondary | ICD-10-CM | POA: Diagnosis not present

## 2021-06-29 ENCOUNTER — Other Ambulatory Visit (HOSPITAL_COMMUNITY): Payer: Self-pay

## 2021-06-29 DIAGNOSIS — Z01419 Encounter for gynecological examination (general) (routine) without abnormal findings: Secondary | ICD-10-CM | POA: Diagnosis not present

## 2021-06-29 MED ORDER — NORETHIN ACE-ETH ESTRAD-FE 1-20 MG-MCG PO TABS
ORAL_TABLET | ORAL | 3 refills | Status: DC
  Filled 2021-06-29: qty 28, 28d supply, fill #0

## 2021-09-04 DIAGNOSIS — Z1231 Encounter for screening mammogram for malignant neoplasm of breast: Secondary | ICD-10-CM | POA: Diagnosis not present

## 2022-11-08 ENCOUNTER — Other Ambulatory Visit (HOSPITAL_COMMUNITY): Payer: Self-pay

## 2022-11-08 MED ORDER — NORETHIN ACE-ETH ESTRAD-FE 1-20 MG-MCG PO TABS
1.0000 | ORAL_TABLET | Freq: Every day | ORAL | 0 refills | Status: DC
  Filled 2022-11-08: qty 28, 28d supply, fill #0

## 2022-12-23 ENCOUNTER — Other Ambulatory Visit (HOSPITAL_COMMUNITY)
Admission: RE | Admit: 2022-12-23 | Discharge: 2022-12-23 | Disposition: A | Discharge status: Home / Self Care | Payer: BC Managed Care – PPO | Source: Ambulatory Visit | Attending: Nurse Practitioner | Admitting: Nurse Practitioner

## 2022-12-23 ENCOUNTER — Other Ambulatory Visit: Payer: Self-pay | Admitting: Nurse Practitioner

## 2022-12-23 DIAGNOSIS — Z01419 Encounter for gynecological examination (general) (routine) without abnormal findings: Secondary | ICD-10-CM | POA: Diagnosis not present

## 2022-12-23 DIAGNOSIS — Z124 Encounter for screening for malignant neoplasm of cervix: Secondary | ICD-10-CM | POA: Insufficient documentation

## 2022-12-29 LAB — CYTOLOGY - PAP
Comment: NEGATIVE
Diagnosis: UNDETERMINED — AB
High risk HPV: NEGATIVE

## 2023-02-04 DIAGNOSIS — Z1231 Encounter for screening mammogram for malignant neoplasm of breast: Secondary | ICD-10-CM | POA: Diagnosis not present

## 2023-02-04 DIAGNOSIS — R92333 Mammographic heterogeneous density, bilateral breasts: Secondary | ICD-10-CM | POA: Diagnosis not present

## 2023-04-29 ENCOUNTER — Encounter: Payer: Self-pay | Admitting: Internal Medicine

## 2023-04-29 ENCOUNTER — Ambulatory Visit (INDEPENDENT_AMBULATORY_CARE_PROVIDER_SITE_OTHER): Payer: BC Managed Care – PPO | Admitting: Internal Medicine

## 2023-04-29 VITALS — BP 124/84 | HR 79 | Temp 98.7°F | Ht <= 58 in | Wt 130.0 lb

## 2023-04-29 DIAGNOSIS — D509 Iron deficiency anemia, unspecified: Secondary | ICD-10-CM

## 2023-04-29 DIAGNOSIS — Z13220 Encounter for screening for lipoid disorders: Secondary | ICD-10-CM

## 2023-04-29 DIAGNOSIS — M1000 Idiopathic gout, unspecified site: Secondary | ICD-10-CM | POA: Diagnosis not present

## 2023-04-29 DIAGNOSIS — J4520 Mild intermittent asthma, uncomplicated: Secondary | ICD-10-CM | POA: Diagnosis not present

## 2023-04-29 MED ORDER — ALBUTEROL SULFATE HFA 108 (90 BASE) MCG/ACT IN AERS: 2.0000 | INHALATION_SPRAY | RESPIRATORY_TRACT | 4 refills | Status: DC | PRN

## 2023-04-29 NOTE — Progress Notes (Signed)
   Subjective:   Patient ID: Carrie Rivera, female    DOB: 07-Jan-1976, 47 y.o.   MRN: [id number]  HPI The patient is a new 47 YO female coming in for gout and ongoing care. See A/P for details.   PMH, [facility], social history reviewed and updated  Review of Systems  Constitutional: Negative.   HENT: Negative.    Eyes: Negative.   Respiratory:  Negative for cough, chest tightness and shortness of breath.   Cardiovascular:  Negative for chest pain, palpitations and leg swelling.  Gastrointestinal:  Negative for abdominal distention, abdominal pain, constipation, diarrhea, nausea and vomiting.  Musculoskeletal:  Positive for arthralgias.  Skin: Negative.   Neurological: Negative.   Psychiatric/Behavioral: Negative.      Objective:  Physical Exam Constitutional:      Appearance: She is well-developed.  HENT:     Head: Normocephalic and atraumatic.  Cardiovascular:     Rate and Rhythm: Normal rate and regular rhythm.  Pulmonary:     Effort: Pulmonary effort is normal. No respiratory distress.     Breath sounds: Normal breath sounds. No wheezing or rales.  Abdominal:     General: Bowel sounds are normal. There is no distension.     Palpations: Abdomen is soft.     Tenderness: There is no abdominal tenderness. There is no rebound.  Musculoskeletal:     Cervical back: Normal range of motion.  Skin:    General: Skin is warm and dry.  Neurological:     Mental Status: She is alert and oriented to person, place, and time.     Coordination: Coordination normal.     Vitals:   04/29/23 1552  BP: 124/84  Pulse: 79  Temp: 98.7 F (37.1 C)  TempSrc: Oral  SpO2: 98%  Weight: 130 lb (59 kg)  Height: 4\' 9"  (1.448 m)    Assessment & Plan:

## 2023-04-29 NOTE — Assessment & Plan Note (Signed)
Checking CBC and adjust as needed. Taking ocp which has helped with heavy periods.

## 2023-04-29 NOTE — Patient Instructions (Signed)
We will check the labs and if you have a flare of the gout let us know

## 2023-04-29 NOTE — Assessment & Plan Note (Signed)
Sounds to be with symptoms starting in great toe. Checking uric acid today. Counseled about diet. She describes flare with finger deformity and no redness with some pain lasting 1-2 months which does not sound as typical for gout.

## 2023-04-29 NOTE — Assessment & Plan Note (Signed)
Has done well off flovent. Will continue albuterol prn only. If symptoms change she will let us know.

## 2023-04-30 LAB — COMPREHENSIVE METABOLIC PANEL
AG Ratio: 1.2 (calc) (ref 1.0–2.5)
ALT: 19 U/L (ref 6–29)
AST: 25 U/L (ref 10–35)
Albumin: 4 g/dL (ref 3.6–5.1)
Alkaline phosphatase (APISO): 77 U/L (ref 31–125)
BUN: 12 mg/dL (ref 7–25)
CO2: 25 mmol/L (ref 20–32)
Calcium: 9.3 mg/dL (ref 8.6–10.2)
Chloride: 105 mmol/L (ref 98–110)
Creat: 0.79 mg/dL (ref 0.50–0.99)
Globulin: 3.4 g/dL (ref 1.9–3.7)
Glucose, Bld: 74 mg/dL (ref 65–99)
Potassium: 4.3 mmol/L (ref 3.5–5.3)
Sodium: 140 mmol/L (ref 135–146)
Total Bilirubin: 0.3 mg/dL (ref 0.2–1.2)
Total Protein: 7.4 g/dL (ref 6.1–8.1)

## 2023-04-30 LAB — CBC
HCT: 39.4 % (ref 35.0–45.0)
Hemoglobin: 12.5 g/dL (ref 11.7–15.5)
MCH: 30 pg (ref 27.0–33.0)
MCHC: 31.7 g/dL — ABNORMAL LOW (ref 32.0–36.0)
MCV: 94.7 fL (ref 80.0–100.0)
MPV: 11.7 fL (ref 7.5–12.5)
Platelets: 255 10*3/uL (ref 140–400)
RBC: 4.16 10*6/uL (ref 3.80–5.10)
RDW: 12.1 % (ref 11.0–15.0)
WBC: 7.8 10*3/uL (ref 3.8–10.8)

## 2023-04-30 LAB — LIPID PANEL
Cholesterol: 160 mg/dL (ref <200)
HDL: 54 mg/dL (ref >=50)
LDL Cholesterol (Calc): 81 mg/dL
Non-HDL Cholesterol (Calc): 106 mg/dL (ref <130)
Total CHOL/HDL Ratio: 3 (calc) (ref <5.0)
Triglycerides: 145 mg/dL (ref <150)

## 2023-04-30 LAB — SPECIMEN COMPROMISED

## 2023-04-30 LAB — URIC ACID: Uric Acid, Serum: 4.9 mg/dL (ref 2.5–7.0)

## 2023-05-11 DIAGNOSIS — K648 Other hemorrhoids: Secondary | ICD-10-CM | POA: Diagnosis not present

## 2023-05-11 DIAGNOSIS — Z1211 Encounter for screening for malignant neoplasm of colon: Secondary | ICD-10-CM | POA: Diagnosis not present

## 2023-05-11 LAB — HM COLONOSCOPY

## 2023-05-19 ENCOUNTER — Encounter: Payer: Self-pay | Admitting: Internal Medicine

## 2023-05-27 DIAGNOSIS — J029 Acute pharyngitis, unspecified: Secondary | ICD-10-CM | POA: Diagnosis not present

## 2023-05-27 DIAGNOSIS — J039 Acute tonsillitis, unspecified: Secondary | ICD-10-CM | POA: Diagnosis not present

## 2023-05-27 DIAGNOSIS — U071 COVID-19: Secondary | ICD-10-CM | POA: Diagnosis not present

## 2023-05-27 DIAGNOSIS — H6692 Otitis media, unspecified, left ear: Secondary | ICD-10-CM | POA: Diagnosis not present

## 2023-05-27 DIAGNOSIS — J020 Streptococcal pharyngitis: Secondary | ICD-10-CM | POA: Diagnosis not present

## 2023-05-27 DIAGNOSIS — R509 Fever, unspecified: Secondary | ICD-10-CM | POA: Diagnosis not present

## 2023-09-23 ENCOUNTER — Telehealth: Payer: BC Managed Care – PPO | Admitting: Nurse Practitioner

## 2023-09-23 ENCOUNTER — Encounter: Payer: Self-pay | Admitting: Internal Medicine

## 2023-09-23 DIAGNOSIS — L03213 Periorbital cellulitis: Secondary | ICD-10-CM | POA: Diagnosis not present

## 2023-09-23 MED ORDER — AMOXICILLIN-POT CLAVULANATE 875-125 MG PO TABS
1.0000 | ORAL_TABLET | Freq: Two times a day (BID) | ORAL | 0 refills | Status: DC
Start: 2023-09-23 — End: 2024-02-29

## 2023-09-23 NOTE — Telephone Encounter (Signed)
 This patient is coming in to see you sarah here is what her eye looks like

## 2023-09-23 NOTE — Assessment & Plan Note (Signed)
 Acute Treat with course of Augmentin BID x 7 days.  We discussed side effects.  She was also encouraged to call office back if symptoms worsen or do not improve especially if she spikes a fever, experiences chills, or notices any visual changes/pain to her eyeball.  She reports understanding.

## 2023-09-23 NOTE — Progress Notes (Signed)
   Established Patient Office Visit  An audio/visual tele-health visit was completed today for this patient. I connected with  Carrie Rivera on 09/23/23 utilizing audio/visual technology and verified that I am speaking with the correct person using two identifiers. The patient was located at their place of employment, and I was located at the office of [facility] at [facility] during the encounter. I discussed the limitations of evaluation and management by telemedicine. The patient expressed understanding and agreed to proceed.    Subjective   Patient ID: Carrie Rivera, female    DOB: May 13, 1976  Age: 48 y.o. MRN: [id number]  Chief Complaint  Patient presents with   Facial Swelling    Patient arrives for virtual visit for the above.  Symptom onset 4 days ago.  Patient reports started out as a stye, but over the last 2 days has been experiencing swelling and worsening pain of the upper right eyelid.  No visual changes, no pain to the eye itself but does experience discomfort when blinking or touching her eyelid.  No fever or chills.  Patient did send pictures also prior to visit today via MyChart message.    ROS: see HPI    Objective:     There were no vitals taken for this visit.   Physical Exam Comprehensive physical exam not completed today as office visit was conducted remotely.  Patient appears stable no acute distress identified.  There is significant swelling of the upper eyelid as well as some redness.  Patient is able to open her eye, again denies visual changes.  Patient was alert and oriented, and appeared to have appropriate judgment.   No results found for any visits on 09/23/23.    The 10-year ASCVD risk score (Arnett DK, et al., 2019) is: 0.8%    Assessment & Plan:   Problem List Items Addressed This Visit       Other   Preseptal cellulitis - Primary   Acute Treat with course of Augmentin BID x 7 days.  We discussed side effects.  She was  also encouraged to call office back if symptoms worsen or do not improve especially if she spikes a fever, experiences chills, or notices any visual changes/pain to her eyeball.  She reports understanding.      Relevant Medications   amoxicillin-clavulanate (AUGMENTIN) 875-125 MG tablet    No follow-ups on file.    Elenore Paddy, NP

## 2024-02-29 ENCOUNTER — Ambulatory Visit: Admitting: Internal Medicine

## 2024-02-29 ENCOUNTER — Encounter: Payer: Self-pay | Admitting: Internal Medicine

## 2024-02-29 VITALS — BP 118/80 | HR 64 | Temp 98.6°F | Ht <= 58 in | Wt 133.0 lb

## 2024-02-29 DIAGNOSIS — Z13220 Encounter for screening for lipoid disorders: Secondary | ICD-10-CM | POA: Diagnosis not present

## 2024-02-29 DIAGNOSIS — J4520 Mild intermittent asthma, uncomplicated: Secondary | ICD-10-CM | POA: Diagnosis not present

## 2024-02-29 DIAGNOSIS — Z0000 Encounter for general adult medical examination without abnormal findings: Secondary | ICD-10-CM

## 2024-02-29 DIAGNOSIS — Z1231 Encounter for screening mammogram for malignant neoplasm of breast: Secondary | ICD-10-CM

## 2024-02-29 LAB — COMPREHENSIVE METABOLIC PANEL WITH GFR
ALT: 40 U/L — ABNORMAL HIGH (ref 0–35)
AST: 34 U/L (ref 0–37)
Albumin: 4.1 g/dL (ref 3.5–5.2)
Alkaline Phosphatase: 85 U/L (ref 39–117)
BUN: 11 mg/dL (ref 6–23)
CO2: 30 meq/L (ref 19–32)
Calcium: 9.5 mg/dL (ref 8.4–10.5)
Chloride: 102 meq/L (ref 96–112)
Creatinine, Ser: 0.67 mg/dL (ref 0.40–1.20)
GFR: 102.57 mL/min (ref >60.00)
Glucose, Bld: 86 mg/dL (ref 70–99)
Potassium: 4.3 meq/L (ref 3.5–5.1)
Sodium: 138 meq/L (ref 135–145)
Total Bilirubin: 0.5 mg/dL (ref 0.2–1.2)
Total Protein: 7.3 g/dL (ref 6.0–8.3)

## 2024-02-29 LAB — CBC
HCT: 42.3 % (ref 36.0–46.0)
Hemoglobin: 13.7 g/dL (ref 12.0–15.0)
MCHC: 32.5 g/dL (ref 30.0–36.0)
MCV: 94.3 fl (ref 78.0–100.0)
Platelets: 188 K/uL (ref 150.0–400.0)
RBC: 4.49 Mil/uL (ref 3.87–5.11)
RDW: 13.1 % (ref 11.5–15.5)
WBC: 7.2 K/uL (ref 4.0–10.5)

## 2024-02-29 LAB — LIPID PANEL
Cholesterol: 164 mg/dL (ref 0–200)
HDL: 55.2 mg/dL (ref >39.00)
LDL Cholesterol: 88 mg/dL (ref 0–99)
NonHDL: 108.95
Total CHOL/HDL Ratio: 3
Triglycerides: 106 mg/dL (ref 0.0–149.0)
VLDL: 21.2 mg/dL (ref 0.0–40.0)

## 2024-02-29 LAB — VITAMIN D 25 HYDROXY (VIT D DEFICIENCY, FRACTURES): VITD: 33.61 ng/mL (ref 30.00–100.00)

## 2024-02-29 MED ORDER — NORETHIN ACE-ETH ESTRAD-FE 1-20 MG-MCG PO TABS: ORAL_TABLET | ORAL | 3 refills | Status: AC

## 2024-02-29 MED ORDER — ALBUTEROL SULFATE HFA 108 (90 BASE) MCG/ACT IN AERS: 2.0000 | INHALATION_SPRAY | RESPIRATORY_TRACT | 4 refills | Status: DC | PRN

## 2024-02-29 MED ORDER — TRIAMCINOLONE ACETONIDE 0.1 % EX CREA: 1.0000 | TOPICAL_CREAM | Freq: Two times a day (BID) | CUTANEOUS | 0 refills | Status: AC

## 2024-02-29 NOTE — Progress Notes (Signed)
   Subjective:   Patient ID: Carrie Rivera, female    DOB: 1976/02/22, 48 y.o.   MRN: [id number]  The patient is here for physical. Pertinent topics discussed: Discussed the use of AI scribe software for clinical note transcription with the patient, who gave verbal consent to proceed.  History of Present Illness Carrie Rivera is a 48 year old female who presents with well check  She has a history of skin moles and reports being itchy in the area where her bra strap rubs, but denies any new concerning skin changes. She uses triamcinolone  for a persistent skin patch, which she initially treated with her daughters' eczema medication.   PMH, [facility] [place], social history reviewed and updated  Review of Systems  Constitutional: Negative.   HENT: Negative.    Eyes: Negative.   Respiratory:  Negative for cough, chest tightness and shortness of breath.   Cardiovascular:  Negative for chest pain, palpitations and leg swelling.  Gastrointestinal:  Negative for abdominal distention, abdominal pain, constipation, diarrhea, nausea and vomiting.  Musculoskeletal: Negative.   Skin: Negative.   Neurological: Negative.   Psychiatric/Behavioral: Negative.      Objective:  Physical Exam Constitutional:      Appearance: She is well-developed.  HENT:     Head: Normocephalic and atraumatic.  Cardiovascular:     Rate and Rhythm: Normal rate and regular rhythm.  Pulmonary:     Effort: Pulmonary effort is normal. No respiratory distress.     Breath sounds: Normal breath sounds. No wheezing or rales.  Abdominal:     General: Bowel sounds are normal. There is no distension.     Palpations: Abdomen is soft.     Tenderness: There is no abdominal tenderness. There is no rebound.  Musculoskeletal:     Cervical back: Normal range of motion.  Skin:    General: Skin is warm and dry.  Neurological:     Mental Status: She is alert and oriented to person, place, and time.     Coordination: Coordination normal.      Vitals:   02/29/24 0806  BP: 118/80  Pulse: 64  Temp: 98.6 F (37 C)  TempSrc: Oral  SpO2: 98%  Weight: 133 lb (60.3 kg)  Height: 4' 9 (1.448 m)    Assessment & Plan:

## 2024-02-29 NOTE — Assessment & Plan Note (Signed)
 Uses albuterol  prn and refilled today. No flare mild intermittent.

## 2024-02-29 NOTE — Assessment & Plan Note (Signed)
 Flu shot yearly. Pneumonia up to date. Shingrix due at 49 counseled. Tetanus up to date. Colonoscopy up to date. Mammogram due ordered, pap smear up to date. Counseled about sun safety and mole surveillance. Counseled about the dangers of distracted driving. Given 10 year screening recommendations.

## 2024-03-04 ENCOUNTER — Encounter: Payer: Self-pay | Admitting: Internal Medicine

## 2024-03-05 ENCOUNTER — Ambulatory Visit: Payer: Self-pay | Admitting: Internal Medicine

## 2024-03-05 MED ORDER — ALBUTEROL SULFATE HFA 108 (90 BASE) MCG/ACT IN AERS: 2.0000 | INHALATION_SPRAY | RESPIRATORY_TRACT | 4 refills | Status: AC | PRN
